# Patient Record
Sex: Female | Born: 1940 | Race: White | Hispanic: No | Smoking: Never smoker
Health system: Southern US, Community
[De-identification: ages and names within clinical notes are randomized; demographics above are authoritative.]

## PROBLEM LIST (undated history)

## (undated) DIAGNOSIS — Z860100 Personal history of colon polyps, unspecified: Secondary | ICD-10-CM

## (undated) DIAGNOSIS — E119 Type 2 diabetes mellitus without complications: Secondary | ICD-10-CM

## (undated) DIAGNOSIS — M2550 Pain in unspecified joint: Secondary | ICD-10-CM

## (undated) DIAGNOSIS — Z8719 Personal history of other diseases of the digestive system: Secondary | ICD-10-CM

## (undated) DIAGNOSIS — G459 Transient cerebral ischemic attack, unspecified: Secondary | ICD-10-CM

## (undated) DIAGNOSIS — G4730 Sleep apnea, unspecified: Secondary | ICD-10-CM

## (undated) DIAGNOSIS — M549 Dorsalgia, unspecified: Secondary | ICD-10-CM

## (undated) DIAGNOSIS — E785 Hyperlipidemia, unspecified: Secondary | ICD-10-CM

## (undated) DIAGNOSIS — G629 Polyneuropathy, unspecified: Secondary | ICD-10-CM

## (undated) DIAGNOSIS — R3915 Urgency of urination: Secondary | ICD-10-CM

## (undated) DIAGNOSIS — Z9889 Other specified postprocedural states: Secondary | ICD-10-CM

## (undated) DIAGNOSIS — M810 Age-related osteoporosis without current pathological fracture: Secondary | ICD-10-CM

## (undated) DIAGNOSIS — G8929 Other chronic pain: Secondary | ICD-10-CM

## (undated) DIAGNOSIS — I10 Essential (primary) hypertension: Secondary | ICD-10-CM

## (undated) DIAGNOSIS — G43909 Migraine, unspecified, not intractable, without status migrainosus: Secondary | ICD-10-CM

## (undated) DIAGNOSIS — N281 Cyst of kidney, acquired: Secondary | ICD-10-CM

## (undated) DIAGNOSIS — M1990 Unspecified osteoarthritis, unspecified site: Secondary | ICD-10-CM

## (undated) DIAGNOSIS — J302 Other seasonal allergic rhinitis: Secondary | ICD-10-CM

## (undated) DIAGNOSIS — K2970 Gastritis, unspecified, without bleeding: Secondary | ICD-10-CM

## (undated) DIAGNOSIS — R350 Frequency of micturition: Secondary | ICD-10-CM

## (undated) DIAGNOSIS — K5790 Diverticulosis of intestine, part unspecified, without perforation or abscess without bleeding: Secondary | ICD-10-CM

## (undated) DIAGNOSIS — M069 Rheumatoid arthritis, unspecified: Secondary | ICD-10-CM

## (undated) DIAGNOSIS — M2540 Effusion, unspecified joint: Secondary | ICD-10-CM

## (undated) DIAGNOSIS — K219 Gastro-esophageal reflux disease without esophagitis: Secondary | ICD-10-CM

## (undated) HISTORY — PX: COLONOSCOPY: SHX174

## (undated) SURGERY — Surgical Case
Anesthesia: *Unknown

---

## 1961-05-25 HISTORY — PX: APPENDECTOMY: SHX54

## 1964-05-25 HISTORY — PX: TUBAL LIGATION: SHX77

## 1966-05-25 HISTORY — PX: FOOT SURGERY: SHX648

## 1991-05-26 DIAGNOSIS — Z9889 Other specified postprocedural states: Secondary | ICD-10-CM

## 1991-05-26 HISTORY — DX: Other specified postprocedural states: Z98.890

## 1992-05-25 HISTORY — PX: ABDOMINAL HYSTERECTOMY: SHX81

## 1992-05-25 HISTORY — PX: OTHER SURGICAL HISTORY: SHX169

## 1993-05-25 HISTORY — PX: CARPAL TUNNEL RELEASE: SHX101

## 2000-05-25 HISTORY — PX: CHOLECYSTECTOMY: SHX55

## 2000-05-25 HISTORY — PX: CYSTOSCOPY: SUR368

## 2000-05-25 HISTORY — PX: OTHER SURGICAL HISTORY: SHX169

## 2008-05-25 HISTORY — PX: KNEE SURGERY: SHX244

## 2008-05-25 HISTORY — PX: JOINT REPLACEMENT: SHX530

## 2008-05-25 HISTORY — PX: OTHER SURGICAL HISTORY: SHX169

## 2012-04-27 ENCOUNTER — Encounter (HOSPITAL_COMMUNITY): Payer: Self-pay | Admitting: *Deleted

## 2012-04-27 ENCOUNTER — Observation Stay (HOSPITAL_COMMUNITY)
Admission: EM | Admit: 2012-04-27 | Discharge: 2012-04-29 | Disposition: A | Discharge status: Home / Self Care | Payer: Medicare Other | Attending: Cardiology | Admitting: Cardiology

## 2012-04-27 DIAGNOSIS — R001 Bradycardia, unspecified: Secondary | ICD-10-CM

## 2012-04-27 DIAGNOSIS — R0609 Other forms of dyspnea: Secondary | ICD-10-CM | POA: Insufficient documentation

## 2012-04-27 DIAGNOSIS — I200 Unstable angina: Secondary | ICD-10-CM

## 2012-04-27 DIAGNOSIS — Z79899 Other long term (current) drug therapy: Secondary | ICD-10-CM | POA: Insufficient documentation

## 2012-04-27 DIAGNOSIS — I498 Other specified cardiac arrhythmias: Secondary | ICD-10-CM | POA: Insufficient documentation

## 2012-04-27 DIAGNOSIS — I10 Essential (primary) hypertension: Secondary | ICD-10-CM

## 2012-04-27 DIAGNOSIS — M069 Rheumatoid arthritis, unspecified: Secondary | ICD-10-CM

## 2012-04-27 DIAGNOSIS — E1142 Type 2 diabetes mellitus with diabetic polyneuropathy: Secondary | ICD-10-CM | POA: Insufficient documentation

## 2012-04-27 DIAGNOSIS — E1149 Type 2 diabetes mellitus with other diabetic neurological complication: Secondary | ICD-10-CM

## 2012-04-27 DIAGNOSIS — R0989 Other specified symptoms and signs involving the circulatory and respiratory systems: Secondary | ICD-10-CM | POA: Insufficient documentation

## 2012-04-27 DIAGNOSIS — R0789 Other chest pain: Principal | ICD-10-CM

## 2012-04-27 DIAGNOSIS — G4733 Obstructive sleep apnea (adult) (pediatric): Secondary | ICD-10-CM

## 2012-04-27 DIAGNOSIS — E785 Hyperlipidemia, unspecified: Secondary | ICD-10-CM

## 2012-04-27 DIAGNOSIS — Z96659 Presence of unspecified artificial knee joint: Secondary | ICD-10-CM | POA: Insufficient documentation

## 2012-04-27 DIAGNOSIS — R4189 Other symptoms and signs involving cognitive functions and awareness: Secondary | ICD-10-CM

## 2012-04-27 DIAGNOSIS — F0390 Unspecified dementia without behavioral disturbance: Secondary | ICD-10-CM | POA: Insufficient documentation

## 2012-04-27 HISTORY — DX: Unspecified osteoarthritis, unspecified site: M19.90

## 2012-04-27 HISTORY — DX: Hyperlipidemia, unspecified: E78.5

## 2012-04-27 HISTORY — DX: Rheumatoid arthritis, unspecified: M06.9

## 2012-04-27 HISTORY — DX: Migraine, unspecified, not intractable, without status migrainosus: G43.909

## 2012-04-27 HISTORY — DX: Type 2 diabetes mellitus without complications: E11.9

## 2012-04-27 HISTORY — DX: Sleep apnea, unspecified: G47.30

## 2012-04-27 HISTORY — DX: Age-related osteoporosis without current pathological fracture: M81.0

## 2012-04-27 HISTORY — DX: Diverticulosis of intestine, part unspecified, without perforation or abscess without bleeding: K57.90

## 2012-04-27 HISTORY — DX: Polyneuropathy, unspecified: G62.9

## 2012-04-27 HISTORY — DX: Gastritis, unspecified, without bleeding: K29.70

## 2012-04-27 LAB — CBC WITH DIFFERENTIAL/PLATELET
Basophils Absolute: 0 10*3/uL (ref 0.0–0.1)
Basophils Relative: 1 % (ref 0–1)
Eosinophils Absolute: 0.2 10*3/uL (ref 0.0–0.7)
Eosinophils Relative: 2 % (ref 0–5)
HCT: 38.7 % (ref 36.0–46.0)
Hemoglobin: 12.6 g/dL (ref 12.0–15.0)
MCH: 28.8 pg (ref 26.0–34.0)
MCHC: 32.6 g/dL (ref 30.0–36.0)
Monocytes Absolute: 0.4 10*3/uL (ref 0.1–1.0)
Monocytes Relative: 6 % (ref 3–12)
RDW: 14.7 % (ref 11.5–15.5)

## 2012-04-27 LAB — COMPREHENSIVE METABOLIC PANEL
AST: 21 U/L (ref 0–37)
Albumin: 3.4 g/dL — ABNORMAL LOW (ref 3.5–5.2)
BUN: 15 mg/dL (ref 6–23)
Calcium: 9.2 mg/dL (ref 8.4–10.5)
Creatinine, Ser: 0.66 mg/dL (ref 0.50–1.10)
Total Bilirubin: 0.1 mg/dL — ABNORMAL LOW (ref 0.3–1.2)
Total Protein: 6.3 g/dL (ref 6.0–8.3)

## 2012-04-27 LAB — POCT I-STAT TROPONIN I: Troponin i, poc: 0.04 ng/mL (ref 0.00–0.08)

## 2012-04-27 NOTE — ED Notes (Addendum)
Pt reports seeing her pcp today and dx w/angina, pt's EKG here in ED is NSR w/low voltage QRS; borderline ECG. Pt requesting something for pain, pt reports taking 2 Rolaids pta, pt is scheduled to have a stress test in the am. Pt reports pain to mid-sternum chest, under (L) breast radiating down (L) arm. Pt also reporting a tightness in her throat.

## 2012-04-27 NOTE — ED Notes (Signed)
Pt states that she was seen at her PCP this morning for similar CP. Pt states that she was diagnosed with angina. Pt states that today she was having pain in her center chest and down left arm. Pt states her husband died a year ago yesterday.

## 2012-04-28 ENCOUNTER — Emergency Department (HOSPITAL_COMMUNITY): Payer: Medicare Other

## 2012-04-28 LAB — APTT: aPTT: 30 seconds (ref 24–37)

## 2012-04-28 LAB — TROPONIN I
Troponin I: <0.3 ng/mL (ref <0.30)
Troponin I: <0.3 ng/mL (ref <0.30)
Troponin I: <0.3 ng/mL (ref <0.30)

## 2012-04-28 LAB — BASIC METABOLIC PANEL
Calcium: 8.8 mg/dL (ref 8.4–10.5)
Creatinine, Ser: 0.71 mg/dL (ref 0.50–1.10)
GFR calc non Af Amer: 85 mL/min — ABNORMAL LOW (ref >90)
Glucose, Bld: 103 mg/dL — ABNORMAL HIGH (ref 70–99)
Sodium: 142 mEq/L (ref 135–145)

## 2012-04-28 LAB — LIPID PANEL
HDL: 54 mg/dL (ref >39)
Total CHOL/HDL Ratio: 3.5 RATIO
Triglycerides: 123 mg/dL (ref <150)

## 2012-04-28 LAB — PROTIME-INR
INR: 0.9 (ref 0.00–1.49)
Prothrombin Time: 12.1 seconds (ref 11.6–15.2)

## 2012-04-28 LAB — CBC
MCH: 28.3 pg (ref 26.0–34.0)
MCHC: 32 g/dL (ref 30.0–36.0)
RBC: 4.03 MIL/uL (ref 3.87–5.11)
RDW: 14.9 % (ref 11.5–15.5)

## 2012-04-28 LAB — CREATININE, SERUM: GFR calc Af Amer: >90 mL/min (ref >90)

## 2012-04-28 LAB — GLUCOSE, CAPILLARY: Glucose-Capillary: 112 mg/dL — ABNORMAL HIGH (ref 70–99)

## 2012-04-28 MED ORDER — SODIUM CHLORIDE 0.9 % IV SOLN
INTRAVENOUS | Status: DC
  Administered 2012-04-28: 22:00:00 via INTRAVENOUS

## 2012-04-28 MED ORDER — DIAZEPAM 5 MG PO TABS
5.0000 mg | ORAL_TABLET | ORAL | Status: AC
  Administered 2012-04-29: 5 mg via ORAL
  Filled 2012-04-28: qty 1

## 2012-04-28 MED ORDER — ASPIRIN EC 81 MG PO TBEC
81.0000 mg | DELAYED_RELEASE_TABLET | Freq: Every day | ORAL | Status: DC
Start: 2012-04-29 — End: 2012-04-29
  Administered 2012-04-29: 81 mg via ORAL
  Filled 2012-04-28: qty 1

## 2012-04-28 MED ORDER — HEPARIN SODIUM (PORCINE) 5000 UNIT/ML IJ SOLN
5000.0000 [IU] | Freq: Two times a day (BID) | INTRAMUSCULAR | Status: DC
  Administered 2012-04-28 (×2): 5000 [IU] via SUBCUTANEOUS
  Filled 2012-04-28 (×4): qty 1

## 2012-04-28 MED ORDER — ASPIRIN 81 MG PO CHEW
324.0000 mg | CHEWABLE_TABLET | Freq: Once | ORAL | Status: AC
  Administered 2012-04-28: 324 mg via ORAL
  Filled 2012-04-28: qty 4

## 2012-04-28 MED ORDER — ONDANSETRON HCL 4 MG/2ML IJ SOLN
4.0000 mg | Freq: Four times a day (QID) | INTRAMUSCULAR | Status: DC | PRN
  Administered 2012-04-28 (×2): 4 mg via INTRAVENOUS
  Filled 2012-04-28 (×2): qty 2

## 2012-04-28 MED ORDER — SODIUM CHLORIDE 0.9 % IJ SOLN
3.0000 mL | Freq: Two times a day (BID) | INTRAMUSCULAR | Status: DC
  Administered 2012-04-28 – 2012-04-29 (×2): 3 mL via INTRAVENOUS

## 2012-04-28 MED ORDER — DONEPEZIL HCL 10 MG PO TABS
10.0000 mg | ORAL_TABLET | Freq: Every day | ORAL | Status: DC
  Administered 2012-04-28: 10 mg via ORAL
  Filled 2012-04-28 (×2): qty 1

## 2012-04-28 MED ORDER — PREDNISONE 50 MG PO TABS
50.0000 mg | ORAL_TABLET | Freq: Every day | ORAL | Status: AC
  Administered 2012-04-29: 50 mg via ORAL
  Filled 2012-04-28: qty 1

## 2012-04-28 MED ORDER — SIMVASTATIN 20 MG PO TABS
20.0000 mg | ORAL_TABLET | Freq: Every day | ORAL | Status: DC
  Administered 2012-04-28: 20 mg via ORAL
  Filled 2012-04-28 (×2): qty 1

## 2012-04-28 MED ORDER — NITROGLYCERIN 0.4 MG SL SUBL
0.4000 mg | SUBLINGUAL_TABLET | SUBLINGUAL | Status: DC | PRN
  Administered 2012-04-28 (×3): 0.4 mg via SUBLINGUAL
  Filled 2012-04-28 (×2): qty 25

## 2012-04-28 MED ORDER — NITROGLYCERIN 2 % TD OINT
0.5000 [in_us] | TOPICAL_OINTMENT | Freq: Once | TRANSDERMAL | Status: AC
  Administered 2012-04-28: 0.5 [in_us] via TOPICAL
  Filled 2012-04-28: qty 1

## 2012-04-28 MED ORDER — SODIUM CHLORIDE 0.9 % IV SOLN: 250.0000 mL | INTRAVENOUS | Status: DC | PRN

## 2012-04-28 MED ORDER — DIPHENHYDRAMINE-APAP (SLEEP) 25-500 MG PO TABS: 1.0000 | ORAL_TABLET | Freq: Every evening | ORAL | Status: DC | PRN

## 2012-04-28 MED ORDER — HYDROXYCHLOROQUINE SULFATE 200 MG PO TABS
200.0000 mg | ORAL_TABLET | Freq: Two times a day (BID) | ORAL | Status: DC
  Administered 2012-04-28 – 2012-04-29 (×3): 200 mg via ORAL
  Filled 2012-04-28 (×4): qty 1

## 2012-04-28 MED ORDER — MORPHINE SULFATE 4 MG/ML IJ SOLN
2.0000 mg | Freq: Once | INTRAMUSCULAR | Status: AC
  Administered 2012-04-28: 2 mg via INTRAVENOUS
  Filled 2012-04-28: qty 1

## 2012-04-28 MED ORDER — ACETAMINOPHEN 325 MG PO TABS
650.0000 mg | ORAL_TABLET | ORAL | Status: DC | PRN
  Administered 2012-04-28: 650 mg via ORAL
  Filled 2012-04-28: qty 2

## 2012-04-28 MED ORDER — ZOLPIDEM TARTRATE 5 MG PO TABS
5.0000 mg | ORAL_TABLET | Freq: Every evening | ORAL | Status: DC | PRN
  Administered 2012-04-28: 5 mg via ORAL
  Filled 2012-04-28: qty 1

## 2012-04-28 MED ORDER — SODIUM CHLORIDE 0.9 % IJ SOLN: 3.0000 mL | INTRAMUSCULAR | Status: DC | PRN

## 2012-04-28 MED ORDER — PREDNISONE 10 MG PO TABS
60.0000 mg | ORAL_TABLET | Freq: Every day | ORAL | Status: DC
  Administered 2012-04-28: 60 mg via ORAL
  Filled 2012-04-28 (×2): qty 1

## 2012-04-28 MED ORDER — PROMETHAZINE HCL 25 MG/ML IJ SOLN
12.5000 mg | Freq: Four times a day (QID) | INTRAMUSCULAR | Status: DC | PRN
  Administered 2012-04-28: 12.5 mg via INTRAVENOUS
  Filled 2012-04-28 (×2): qty 1

## 2012-04-28 MED ORDER — NITROGLYCERIN 0.4 MG SL SUBL: 0.4000 mg | SUBLINGUAL_TABLET | SUBLINGUAL | Status: DC | PRN

## 2012-04-28 MED ORDER — INSULIN ASPART 100 UNIT/ML ~~LOC~~ SOLN
0.0000 [IU] | Freq: Three times a day (TID) | SUBCUTANEOUS | Status: DC
  Administered 2012-04-28 – 2012-04-29 (×3): 2 [IU] via SUBCUTANEOUS
  Administered 2012-04-29: 1 [IU] via SUBCUTANEOUS

## 2012-04-28 MED ORDER — FAMOTIDINE 40 MG PO TABS
40.0000 mg | ORAL_TABLET | Freq: Every day | ORAL | Status: DC
  Administered 2012-04-28 – 2012-04-29 (×2): 40 mg via ORAL
  Filled 2012-04-28 (×2): qty 1

## 2012-04-28 MED ORDER — PANTOPRAZOLE SODIUM 20 MG PO TBEC
20.0000 mg | DELAYED_RELEASE_TABLET | Freq: Every day | ORAL | Status: DC
  Administered 2012-04-28 – 2012-04-29 (×2): 20 mg via ORAL
  Filled 2012-04-28 (×2): qty 1

## 2012-04-28 MED ORDER — METOPROLOL TARTRATE 12.5 MG HALF TABLET
12.5000 mg | ORAL_TABLET | Freq: Two times a day (BID) | ORAL | Status: DC
  Administered 2012-04-28 – 2012-04-29 (×3): 12.5 mg via ORAL
  Filled 2012-04-28 (×4): qty 1

## 2012-04-28 NOTE — H&P (Signed)
History and Physical  Patient ID: Shelia Barber MRN: [id number], SOB: 1941/05/03 71 y.o. Date of Encounter: 04/28/2012, 2:26 AM  Primary Physician: Tarri Fuller, MD Primary Cardiologist: none  Chief Complaint: chest pain  HPI: 71 y.o. female w/ PMHx significant for DM2, HTN who presented to [facility] on 04/28/2012 with complaints of chest pain. She reports having intermittent symptoms over the last week. The initial symptom was left arm pain. This evolved into occasional left neck pain and then left sided chest pain and the tonight, central chest pain. Symptoms are not always associated with each other. Have occurred at rest ( reading newspaper). Unclear if they have occurred with exertion but she has noted increased dyspnea on exertion. She admits that she is not the best historian ("can't remember what I had for breakfast" which she attributes to her diagnosis of dementia). Occasional lightheadedness which is worse than prior but unclear if coincides with her chest pain. Another symptom includes throat tightness. Does feel nauseated this evening, no emesis.  She saw her PCP yesterday 12/4 who was concerned about the possibility of angina (she reports nl EKG at that time) and referred her for stress testing. However, her symptoms worsened to 10/10 during the evening prompting her to seek emergent care.  She reported a 5/10 chest pain, relieved slightly with each of the nitro and with the morphine. Down to 3/10 with addition of nitropaste. At last check, down to 0-1.  EKG revealed NSR with no acute ST changes. CXR was without acute cardiopulmonary abnormalities. Labs are significant for negative istat troponin.   Past Medical History  Diagnosis Date  . Diabetes mellitus without complication   . Gastritis   . Anxiety   . Osteoarthritis   . Sleep apnea   . Osteoporosis   . Neuropathy   . Diverticulosis   . Migraines   . RA (rheumatoid arthritis)   . Cyst of right kidney      two   . Kidney stones   . Cataract      Surgical History:  Past Surgical History  Procedure Date  . Cesarean section   . Appendectomy   . Abdominal hysterectomy     parcel  . Cholecystectomy   . Joint replacement     left knee     Home Meds: Prior to Admission medications   Medication Sig Start Date End Date Taking? Authorizing Provider  acetaminophen (TYLENOL) 500 MG tablet Take 1,000 mg by mouth every 6 (six) hours as needed. Headache or pain   Yes Historical Provider, MD  alendronate (FOSAMAX) 70 MG tablet Take 70 mg by mouth every 7 (seven) days. Take with a full glass of water on an empty stomach. Weekly on Monday   Yes Historical Provider, MD  aspirin EC 81 MG tablet Take 81 mg by mouth daily.   Yes Historical Provider, MD  calcium carbonate (OS-CAL - DOSED IN MG OF ELEMENTAL CALCIUM) 1250 MG tablet Take 1 tablet by mouth 2 (two) times daily.   Yes Historical Provider, MD  cetirizine (ZYRTEC) 10 MG tablet Take 10 mg by mouth daily.   Yes Historical Provider, MD  Diphenhydramine-APAP, sleep, (EQ ACETAMINOPHEN PM PO) Take 1 tablet by mouth at bedtime as needed. sleep   Yes Historical Provider, MD  donepezil (ARICEPT) 10 MG tablet Take 10 mg by mouth at bedtime as needed.   Yes Historical Provider, MD  hydroxychloroquine (PLAQUENIL) 200 MG tablet Take 200 mg by mouth 2 (two) times daily.  Yes Historical Provider, MD  lansoprazole (PREVACID) 15 MG capsule Take 15 mg by mouth daily.   Yes Historical Provider, MD  loratadine (CLARITIN) 10 MG tablet Take 10 mg by mouth daily.   Yes Historical Provider, MD  metFORMIN (GLUCOPHAGE) 500 MG tablet Take 500 mg by mouth 2 (two) times daily with a meal.   Yes Historical Provider, MD  Probiotic Product (ALIGN) 4 MG CAPS Take 1 capsule by mouth daily.   Yes Historical Provider, MD  protein supplement (RESOURCE BENEPROTEIN) POWD Take 1 scoop by mouth 2 (two) times daily as needed. For irritable bowel symptoms   Yes Historical Provider, MD     Allergies:  Allergies  Allergen Reactions  . Ivp Dye (Iodinated Diagnostic Agents) Anaphylaxis  . Aspirin Other (See Comments)    Upset stomach Pt does take coated baby aspirin  . Imodium (Loperamide) Other (See Comments)    Due to Diverticulosis    History   Social History  . Marital Status: Married    Spouse Name: N/A    Number of Children: N/A  . Years of Education: N/A   Occupational History  . Not on file.   Social History Main Topics  . Smoking status: Never Smoker   . Smokeless tobacco: Not on file  . Alcohol Use: No  . Drug Use: No  . Sexually Active:    Other Topics Concern  . Not on file   Social History Narrative  . No narrative on file     History reviewed. No pertinent family history.  Review of Systems: General: negative for chills, fever, night sweats or weight changes.  Cardiovascular: as per HPI, no CHF symptoms, R leg always slightly swollen due to injury Dermatological: negative for rash Respiratory: negative for cough or wheezing Urologic: negative for hematuria Abdominal: negative for  vomiting, bright red blood per rectum, melena, or hematemesis. +diarrhea- ?IBS per pt Neurologic: negative for visual changes, syncope/ +lightheadedness All other systems reviewed and are otherwise negative except as noted above.  Labs:   Lab Results  Component Value Date   WBC 6.2 04/27/2012   HGB 12.6 04/27/2012   HCT 38.7 04/27/2012   MCV 88.4 04/27/2012   PLT 189 04/27/2012    Lab 04/27/12 2103  NA 143  K 4.1  CL 105  CO2 26  BUN 15  CREATININE 0.66  CALCIUM 9.2  PROT 6.3  BILITOT 0.1*  ALKPHOS 76  ALT 16  AST 21  GLUCOSE 140*   No results found for this basename: CKTOTAL:4,CKMB:4,TROPONINI:4 in the last 72 hours No results found for this basename: CHOL, HDL, LDLCALC, TRIG   No results found for this basename: DDIMER    Radiology/Studies:  Dg Chest Port 1 View  04/28/2012  *RADIOLOGY REPORT*  Clinical Data: Chest pain and  shortness of breath.  PORTABLE CHEST - 1 VIEW  Comparison: None.  Findings: Low lung volumes are seen.  Both lungs are clear.  No evidence of pleural effusion.  Heart size is normal.  IMPRESSION: Low lung volumes.  No acute findings.   Original Report Authenticated By: Myles Rosenthal, M.D.      EKG: sinus, no acute changes, no comparison  Physical Exam: Blood pressure 149/66, pulse 61, temperature 98 F (36.7 C), temperature source Oral, resp. rate 16, SpO2 98.00%. General: Well developed, well nourished, in no acute distress. Head: Normocephalic, atraumatic, sclera non-icteric, nares are without discharge Neck: Supple. Negative for carotid bruits. JVD not elevated. Lungs: Clear bilaterally to auscultation without wheezes,  rales, or rhonchi. Breathing is unlabored. Heart: RRR with S1 S2. No murmurs, rubs, or gallops appreciated. Abdomen: Soft, non-tender, non-distended with normoactive bowel sounds. No rebound/guarding. No obvious abdominal masses. Msk:  Strength and tone appear normal for age. Extremities: R leg with +1 edema (chronic per pt). No clubbing or cyanosis. Distal pedal pulses are 2+ and equal bilaterally. Neuro: Alert and oriented X 3. Moves all extremities spontaneously. Psych:  Responds to questions appropriately with a normal affect.   1. Chest pain, mostly typical symptoms 2. DM2 3. HTN 4. Mild dementia/cognitive decline  ASSESSMENT AND PLAN:  71 y.o. female w/ PMHx significant for DM2, HTN who presented to [facility] on 04/28/2012 with complaints of chest pain.   Her symptoms are mostly typical in nature (left sided, radiation to neck, relieved with nitro, nausea, diaphoresis) but what is atypical is the number of different types of discomfort that she has that are not necessarily related temporally (throat tightness sometimes, central chest pain sometimes, left sided chest pain, left arm pain).   Nonetheless, she has risk factors (post-menopauseal, DM2, HTN,  rheumatoid arthritis). Encouragingly, her EKG is normal and her initial troponin is negative. Goal of being chest pain free almost achieved with nitro paste.  In the absence of elevated biomarkers or EKG changes, will hold off on heparinizing but will have low threshold to start if clinical picture changes. If biomarkers and symptoms remain negative overnight, her presentation allows for either invasive or noninvasive risk stratification (pharmacologic stress).  Start statin, low dose BB for BP. Hold metformin, cover with sliding scale. **NOTE: Patient reports hives with contrast media.  NPO for either cath or MPI.  PPI for prophylaxis. Subq heparin for DVT prophy Full code  Signed, Othell Jaime C. MD 04/28/2012, 2:26 AM

## 2012-04-28 NOTE — Progress Notes (Signed)
Utilization review completed.  

## 2012-04-28 NOTE — ED Provider Notes (Signed)
History     CSN: [id number]  Arrival date & time 04/27/12  2051   First MD Initiated Contact with Patient 04/27/12 2352      Chief Complaint  Patient presents with  . Chest Pain    (Consider location/radiation/quality/duration/timing/severity/associated sxs/prior treatment) HPI Comments: 71 year old female with a history of diabetes, history of hypertension though she no longer takes medications and a history of family history of heart disease with radiation to the left arm, a feeling of choking, dyspnea of exertion. She was seen at her family doctor's earlier in the day and told that she had angina, scheduled for a stress test today, her pain came on again at 8:30 last night, it has been persistent, moderate, rated 10 out of 10, nothing seems to make this better or worse. She has not had any nitroglycerin today, she does take intermittent aspirin  Patient is a 71 y.o. female presenting with chest pain. The history is provided by the patient.  Chest Pain     Past Medical History  Diagnosis Date  . Diabetes mellitus without complication   . Gastritis   . Anxiety   . Osteoarthritis   . Sleep apnea   . Osteoporosis   . Neuropathy   . Diverticulosis   . Migraines   . RA (rheumatoid arthritis)   . Cyst of right kidney     two   . Kidney stones   . Cataract     Past Surgical History  Procedure Date  . Cesarean section   . Appendectomy   . Abdominal hysterectomy     parcel  . Cholecystectomy   . Joint replacement     left knee    History reviewed. No pertinent family history.  History  Substance Use Topics  . Smoking status: Never Smoker   . Smokeless tobacco: Not on file  . Alcohol Use: No    OB History    Grav Para Term Preterm Abortions TAB SAB Ect Mult Living                  Review of Systems  Cardiovascular: Positive for chest pain.  All other systems reviewed and are negative.    Allergies  Ivp dye; Aspirin; and Imodium  Home Medications    Current Outpatient Rx  Name  Route  Sig  Dispense  Refill  . ACETAMINOPHEN 500 MG PO TABS   Oral   Take 1,000 mg by mouth every 6 (six) hours as needed. Headache or pain         . ALENDRONATE SODIUM 70 MG PO TABS   Oral   Take 70 mg by mouth every 7 (seven) days. Take with a full glass of water on an empty stomach. Weekly on Monday         . ASPIRIN EC 81 MG PO TBEC   Oral   Take 81 mg by mouth daily.         Marland Kitchen CALCIUM CARBONATE 1250 MG PO TABS   Oral   Take 1 tablet by mouth 2 (two) times daily.         Marland Kitchen CETIRIZINE HCL 10 MG PO TABS   Oral   Take 10 mg by mouth daily.         . EQ ACETAMINOPHEN PM PO   Oral   Take 1 tablet by mouth at bedtime as needed. sleep         . DONEPEZIL HCL 10 MG PO TABS   Oral  Take 10 mg by mouth at bedtime as needed.         Marland Kitchen HYDROXYCHLOROQUINE SULFATE 200 MG PO TABS   Oral   Take 200 mg by mouth 2 (two) times daily.         Marland Kitchen LANSOPRAZOLE 15 MG PO CPDR   Oral   Take 15 mg by mouth daily.         Marland Kitchen LORATADINE 10 MG PO TABS   Oral   Take 10 mg by mouth daily.         Marland Kitchen METFORMIN HCL 500 MG PO TABS   Oral   Take 500 mg by mouth 2 (two) times daily with a meal.         . ALIGN 4 MG PO CAPS   Oral   Take 1 capsule by mouth daily.         Scherrie November PO POWD   Oral   Take 1 scoop by mouth 2 (two) times daily as needed. For irritable bowel symptoms           BP 149/66  Pulse 61  Temp 98 F (36.7 C) (Oral)  Resp 16  SpO2 98%  Physical Exam  Nursing note and vitals reviewed. Constitutional: She appears well-developed and well-nourished. No distress.  HENT:  Head: Normocephalic and atraumatic.  Mouth/Throat: Oropharynx is clear and moist. No oropharyngeal exudate.  Eyes: Conjunctivae normal and EOM are normal. Pupils are equal, round, and reactive to light. Right eye exhibits no discharge. Left eye exhibits no discharge. No scleral icterus.  Neck: Normal range of motion. Neck supple. No JVD  present. No thyromegaly present.  Cardiovascular: Normal rate, regular rhythm, normal heart sounds and intact distal pulses.  Exam reveals no gallop and no friction rub.   No murmur heard. Pulmonary/Chest: Effort normal and breath sounds normal. No respiratory distress. She has no wheezes. She has no rales.  Abdominal: Soft. Bowel sounds are normal. She exhibits no distension and no mass. There is no tenderness.  Musculoskeletal: Normal range of motion. She exhibits no edema and no tenderness.  Lymphadenopathy:    She has no cervical adenopathy.  Neurological: She is alert. Coordination normal.  Skin: Skin is warm and dry. No rash noted. No erythema.  Psychiatric: She has a normal mood and affect. Her behavior is normal.    ED Course  Procedures (including critical care time)  Labs Reviewed  COMPREHENSIVE METABOLIC PANEL - Abnormal; Notable for the following:    Glucose, Bld 140 (*)     Albumin 3.4 (*)     Total Bilirubin 0.1 (*)     GFR calc non Af Amer 87 (*)     All other components within normal limits  CBC WITH DIFFERENTIAL  POCT I-STAT TROPONIN I  APTT  PROTIME-INR   Dg Chest Port 1 View  04/28/2012  *RADIOLOGY REPORT*  Clinical Data: Chest pain and shortness of breath.  PORTABLE CHEST - 1 VIEW  Comparison: None.  Findings: Low lung volumes are seen.  Both lungs are clear.  No evidence of pleural effusion.  Heart size is normal.  IMPRESSION: Low lung volumes.  No acute findings.   Original Report Authenticated By: Myles Rosenthal, M.D.      1. Unstable angina       MDM  At this time the patient is not having signs of dysfunction, she has an EKG with nonspecific T waves but no signs of acute ischemia, she does have what sounds to be  unstable angina given her 2 weeks of progressive symptoms, laboratory data has a normal troponin, CBC, normal cooperative metabolic panel will consult cardiology.  ED ECG REPORT  I personally interpreted this EKG   Date: 04/28/2012   Rate: 65   Rhythm: normal sinus rhythm  QRS Axis: left  Intervals: normal  ST/T Wave abnormalities: nonspecific T wave changes  Conduction Disutrbances:none  Narrative Interpretation:   Old EKG Reviewed: none available  Patient has been reevaluated, she still has ongoing chest pain which is now 7/10 from 10 out of 10. The nitroglycerin sublingual tablets has helped improve her pain, it has seemed to come back since the nitroglycerin. Morphine ordered, nitro paste ordered at the recommendation of the cardiologist, I suspect that the patient is having unstable angina, I have reevaluated her several times, she is hemodynamically stable on each reevaluation but has ongoing pain and appears uncomfortable. Her chest x-ray showed no acute findings, troponin is negative, critical care delivered.   CRITICAL CARE Performed by: Vida Roller   Total critical care time: 35  Critical care time was exclusive of separately billable procedures and treating other patients.  Critical care was necessary to treat or prevent imminent or life-threatening deterioration.  Critical care was time spent personally by me on the following activities: development of treatment plan with patient and/or surrogate as well as nursing, discussions with consultants, evaluation of patient's response to treatment, examination of patient, obtaining history from patient or surrogate, ordering and performing treatments and interventions, ordering and review of laboratory studies, ordering and review of radiographic studies, pulse oximetry and re-evaluation of patient's condition.   Vida Roller, MD 04/28/12 [phone]

## 2012-04-28 NOTE — Progress Notes (Signed)
Cardiac enzymes are negative.  Patient was not pretreated with meds yesterday.  No further pain today. Had nausea earlier this evening, then some vomiting.  No tenderness on exam.  No ECG changes.  Enzymes at 5pm today were negative.    Plan is for cath study tomorrow.  She is agreeable to proceed.  Will start some IV fluids, give clear liquid breakfast, and redo Prednisone in am.   Cath tomorrow.

## 2012-04-28 NOTE — ED Notes (Signed)
MD at bedside. 

## 2012-04-28 NOTE — Progress Notes (Signed)
Patient Name: Shelia Barber Date of Encounter: 04/28/2012     Active Problems:  * No active hospital problems. *     SUBJECTIVE  No further pain.  Initial markers negative. Careful history taken.  She has sharp chest pain that did not ease off while wrapping presents.  She also has a two week history of left arm pain.  She also provides history of hives with contrast.    CURRENT MEDS    . [COMPLETED] aspirin  324 mg Oral Once  . aspirin EC  81 mg Oral Daily  . donepezil  10 mg Oral QHS  . famotidine  40 mg Oral Daily  . heparin  5,000 Units Subcutaneous BID  . hydroxychloroquine  200 mg Oral BID  . insulin aspart  0-9 Units Subcutaneous TID WC  . metoprolol tartrate  12.5 mg Oral BID  . [COMPLETED] morphine  2 mg Intravenous Once  . [COMPLETED] nitroGLYCERIN  0.5 inch Topical Once  . pantoprazole  20 mg Oral Daily  . predniSONE  60 mg Oral Q breakfast  . simvastatin  20 mg Oral q1800    OBJECTIVE  Filed Vitals:   04/28/12 0300 04/28/12 0315 04/28/12 0330 04/28/12 0454  BP: 113/59 113/58 116/57 146/70  Pulse: 62 61 62 60  Temp:    97.9 F (36.6 C)  TempSrc:    Oral  Resp:  14 14 16   Height:    5\' 1"  (1.549 m)  Weight:    233 lb 11 oz (106 kg)  SpO2: 96% 96% 96% 98%   No intake or output data in the 24 hours ending 04/28/12 0759 Filed Weights   04/28/12 0454  Weight: 233 lb 11 oz (106 kg)    PHYSICAL EXAM  General: Pleasant, NAD. Neuro: Alert and oriented X 3. Moves all extremities spontaneously. Psych: Normal affect. HEENT:  Normal  Neck: Supple without bruits or JVD. Lungs:  Resp regular and unlabored, CTA. Heart: RRR no s3, s4, or murmurs. Extremities: No clubbing, cyanosis or edema. DP/PT/Radials 2+ and equal bilaterally.  Accessory Clinical Findings  CBC  Basename 04/28/12 0330 04/27/12 2103  WBC 5.9 6.2  NEUTROABS -- 3.4  HGB 11.4* 12.6  HCT 35.6* 38.7  MCV 88.3 88.4  PLT 176 189   Basic Metabolic Panel  Basename 04/28/12 0330 04/27/12  2103  NA -- 143  K -- 4.1  CL -- 105  CO2 -- 26  GLUCOSE -- 140*  BUN -- 15  CREATININE 0.66 0.66  CALCIUM -- 9.2  MG -- --  PHOS -- --   Liver Function Tests  [facility] 04/27/12 2103  AST 21  ALT 16  ALKPHOS 76  BILITOT 0.1*  PROT 6.3  ALBUMIN 3.4*   No results found for this basename: LIPASE:2,AMYLASE:2 in the last 72 hours Cardiac Enzymes  Basename 04/28/12 0330  CKTOTAL --  CKMB --  CKMBINDEX --  TROPONINI <0.30   BNP No components found with this basename: POCBNP:3 D-Dimer No results found for this basename: DDIMER:2 in the last 72 hours Hemoglobin A1C No results found for this basename: HGBA1C in the last 72 hours Fasting Lipid Panel  Basename 04/28/12 0330  CHOL 191  HDL 54  LDLCALC 112*  TRIG 123  CHOLHDL 3.5  LDLDIRECT --   Thyroid Function Tests No results found for this basename: TSH,T4TOTAL,FREET3,T3FREE,THYROIDAB in the last 72 hours    ECG  First tracing without changes.    Radiology/Studies  Dg Chest [facility] [address]  04/28/2012  *RADIOLOGY REPORT*  Clinical Data: Chest pain and shortness of breath.  PORTABLE CHEST - 1 VIEW  Comparison: None.  Findings: Low lung volumes are seen.  Both lungs are clear.  No evidence of pleural effusion.  Heart size is normal.  IMPRESSION: Low lung volumes.  No acute findings.   Original Report Authenticated By: Myles Rosenthal, M.D.     ASSESSMENT AND PLAN   1.  Chest pain  --worrisome for accelerating angina 2.  DM 3.  Other CRF  Rec:  1.  I would favor cath.  She is stable and has history of contrast allergy so will begin pretreatment.  I will check on her later today but most likely study will be tomorrow am.    Signed, Shawnie Pons MD, [facility], [facility]

## 2012-04-29 ENCOUNTER — Encounter (HOSPITAL_COMMUNITY): Payer: Self-pay | Admitting: Physician Assistant

## 2012-04-29 ENCOUNTER — Encounter (HOSPITAL_COMMUNITY)
Admission: EM | Disposition: A | Discharge status: Home / Self Care | Payer: Self-pay | Source: Home / Self Care | Attending: Emergency Medicine

## 2012-04-29 DIAGNOSIS — R4189 Other symptoms and signs involving cognitive functions and awareness: Secondary | ICD-10-CM

## 2012-04-29 DIAGNOSIS — E785 Hyperlipidemia, unspecified: Secondary | ICD-10-CM

## 2012-04-29 DIAGNOSIS — G4733 Obstructive sleep apnea (adult) (pediatric): Secondary | ICD-10-CM

## 2012-04-29 DIAGNOSIS — R001 Bradycardia, unspecified: Secondary | ICD-10-CM

## 2012-04-29 DIAGNOSIS — I10 Essential (primary) hypertension: Secondary | ICD-10-CM

## 2012-04-29 DIAGNOSIS — R0789 Other chest pain: Secondary | ICD-10-CM

## 2012-04-29 DIAGNOSIS — M069 Rheumatoid arthritis, unspecified: Secondary | ICD-10-CM

## 2012-04-29 DIAGNOSIS — I2510 Atherosclerotic heart disease of native coronary artery without angina pectoris: Secondary | ICD-10-CM

## 2012-04-29 DIAGNOSIS — E1149 Type 2 diabetes mellitus with other diabetic neurological complication: Secondary | ICD-10-CM

## 2012-04-29 HISTORY — PX: LEFT HEART CATHETERIZATION WITH CORONARY ANGIOGRAM: SHX5451

## 2012-04-29 HISTORY — PX: CARDIAC CATHETERIZATION: SHX172

## 2012-04-29 LAB — COMPREHENSIVE METABOLIC PANEL
ALT: 15 U/L (ref 0–35)
ALT: 15 U/L (ref 0–35)
AST: 15 U/L (ref 0–37)
Alkaline Phosphatase: 55 U/L (ref 39–117)
CO2: 29 mEq/L (ref 19–32)
Calcium: 8.7 mg/dL (ref 8.4–10.5)
Chloride: 107 mEq/L (ref 96–112)
Creatinine, Ser: 0.66 mg/dL (ref 0.50–1.10)
GFR calc Af Amer: >90 mL/min (ref >90)
GFR calc Af Amer: >90 mL/min (ref >90)
GFR calc non Af Amer: 88 mL/min — ABNORMAL LOW (ref >90)
Glucose, Bld: 126 mg/dL — ABNORMAL HIGH (ref 70–99)
Glucose, Bld: 108 mg/dL — ABNORMAL HIGH (ref 70–99)
Potassium: 3.9 mEq/L (ref 3.5–5.1)
Sodium: 143 mEq/L (ref 135–145)
Sodium: 146 mEq/L — ABNORMAL HIGH (ref 135–145)
Total Bilirubin: 0.2 mg/dL — ABNORMAL LOW (ref 0.3–1.2)
Total Protein: 5.6 g/dL — ABNORMAL LOW (ref 6.0–8.3)

## 2012-04-29 LAB — GLUCOSE, CAPILLARY: Glucose-Capillary: 107 mg/dL — ABNORMAL HIGH (ref 70–99)

## 2012-04-29 LAB — CBC
HCT: 36.6 % (ref 36.0–46.0)
Hemoglobin: 11.6 g/dL — ABNORMAL LOW (ref 12.0–15.0)
MCHC: 31.7 g/dL (ref 30.0–36.0)
RBC: 4.13 MIL/uL (ref 3.87–5.11)
WBC: 7 10*3/uL (ref 4.0–10.5)

## 2012-04-29 LAB — LIPASE, BLOOD: Lipase: 56 U/L (ref 11–59)

## 2012-04-29 SURGERY — LEFT HEART CATHETERIZATION WITH CORONARY ANGIOGRAM
Anesthesia: LOCAL

## 2012-04-29 MED ORDER — ACETAMINOPHEN 325 MG PO TABS: 650.0000 mg | ORAL_TABLET | ORAL | Status: DC | PRN

## 2012-04-29 MED ORDER — ONDANSETRON HCL 4 MG/2ML IJ SOLN: 4.0000 mg | Freq: Four times a day (QID) | INTRAMUSCULAR | Status: DC | PRN

## 2012-04-29 MED ORDER — DIPHENHYDRAMINE HCL 50 MG/ML IJ SOLN
INTRAMUSCULAR | Status: AC
  Filled 2012-04-29: qty 1

## 2012-04-29 MED ORDER — ATORVASTATIN CALCIUM 20 MG PO TABS: 20.0000 mg | ORAL_TABLET | Freq: Every day | ORAL | Status: DC

## 2012-04-29 MED ORDER — NITROGLYCERIN 0.2 MG/ML ON CALL CATH LAB
INTRAVENOUS | Status: AC
  Filled 2012-04-29: qty 1

## 2012-04-29 MED ORDER — MIDAZOLAM HCL 2 MG/2ML IJ SOLN
INTRAMUSCULAR | Status: AC
  Filled 2012-04-29: qty 2

## 2012-04-29 MED ORDER — HEPARIN (PORCINE) IN NACL 2-0.9 UNIT/ML-% IJ SOLN
INTRAMUSCULAR | Status: AC
  Filled 2012-04-29: qty 1500

## 2012-04-29 MED ORDER — METFORMIN HCL 500 MG PO TABS: 500.0000 mg | ORAL_TABLET | Freq: Two times a day (BID) | ORAL | Status: DC

## 2012-04-29 MED ORDER — OXYCODONE-ACETAMINOPHEN 5-325 MG PO TABS: 1.0000 | ORAL_TABLET | ORAL | Status: DC | PRN

## 2012-04-29 MED ORDER — LIDOCAINE HCL (PF) 1 % IJ SOLN
INTRAMUSCULAR | Status: AC
  Filled 2012-04-29: qty 30

## 2012-04-29 MED ORDER — SODIUM CHLORIDE 0.45 % IV SOLN
INTRAVENOUS | Status: AC
  Administered 2012-04-29: 11:00:00 via INTRAVENOUS

## 2012-04-29 MED ORDER — DIAZEPAM 2 MG PO TABS: 2.0000 mg | ORAL_TABLET | ORAL | Status: DC | PRN

## 2012-04-29 NOTE — Progress Notes (Signed)
Cardiology Progress Note Patient Name: Shelia Barber Date of Encounter: 04/29/2012, 8:01 AM     Subjective  One episode of chest pain last night that spontaneously resolved. No chest pain or sob this morning.    Objective   Telemetry: sinus rhythm 50-60s  Medications: . aspirin EC  81 mg Oral Daily  . diazepam  5 mg Oral On Call  . donepezil  10 mg Oral QHS  . famotidine  40 mg Oral Daily  . heparin  5,000 Units Subcutaneous BID  . hydroxychloroquine  200 mg Oral BID  . insulin aspart  0-9 Units Subcutaneous TID WC  . metoprolol tartrate  12.5 mg Oral BID  . pantoprazole  20 mg Oral Daily  . predniSONE  50 mg Oral Q breakfast  . simvastatin  20 mg Oral q1800  . sodium chloride  3 mL Intravenous Q12H  . [DISCONTINUED] predniSONE  60 mg Oral Q breakfast   . sodium chloride 50 mL/hr at 04/28/12 2150    Physical Exam: Temp:  [97.3 F (36.3 C)-98.2 F (36.8 C)] 97.3 F (36.3 C) (12/06 0500) Pulse Rate:  [55-68] 68  (12/06 0500) Resp:  [18] 18  (12/05 2100) BP: (115-145)/(64-77) 145/64 mmHg (12/06 0500) SpO2:  [94 %-99 %] 99 % (12/06 0500) Weight:  [233 lb (105.688 kg)] 233 lb (105.688 kg) (12/06 0500)  General: Pleasant elderly white female, in no acute distress. Head: Normocephalic, atraumatic, sclera non-icteric, nares are without discharge.  Neck: Supple. Negative for carotid bruits or JVD Lungs: Clear bilaterally to auscultation without wheezes, rales, or rhonchi. Breathing is unlabored. Heart: RRR S1 S2 without murmurs, rubs, or gallops.  Abdomen: Soft, non-tender, non-distended with normoactive bowel sounds. No rebound/guarding. No obvious abdominal masses. Msk:  Strength and tone appear normal for age. Extremities: Trace bilat ankle edema. No clubbing or cyanosis. Distal pedal pulses are intact and equal bilaterally. Neuro: Alert and oriented X 3. Moves all extremities spontaneously. Psych:  Responds to questions appropriately with a normal  affect.   Intake/Output Summary (Last 24 hours) at 04/29/12 0801 Last data filed at 04/28/12 1700  Gross per 24 hour  Intake   1200 ml  Output      3 ml  Net   1197 ml    Labs:  [facility] 04/29/12 0535 04/29/12 0025  NA 146* 143  K 4.2 3.9  CL 108 107  CO2 30 29  GLUCOSE 108* 126*  BUN 10 10  CREATININE 0.66 0.65  CALCIUM 8.7 8.9  MG -- --  PHOS -- --    Basename 04/29/12 0535 04/29/12 0025  AST 17 15  ALT 15 15  ALKPHOS 53 55  BILITOT 0.2* 0.2*  PROT 5.6* 5.8*  ALBUMIN 2.9* 3.1*    Basename 04/29/12 0535  LIPASE 56  AMYLASE --    Basename 04/29/12 0535 04/28/12 0330 04/27/12 2103  WBC 7.0 5.9 --  NEUTROABS -- -- 3.4  HGB 11.6* 11.4* --  HCT 36.6 35.6* --  MCV 88.6 88.3 --  PLT 170 176 --    Basename 04/28/12 1708 04/28/12 0850 04/28/12 0330  CKTOTAL -- -- --  CKMB -- -- --  TROPONINI <0.30 <0.30 <0.30   Basename 04/28/12 0330  CHOL 191  HDL 54  LDLCALC 112*  TRIG 123  CHOLHDL 3.5    Radiology/Studies:  Dg Chest Port 1 View  04/28/2012  *RADIOLOGY REPORT*  Clinical Data: Chest pain and shortness of breath.  PORTABLE CHEST - 1  VIEW  Comparison: None.  Findings: Low lung volumes are seen.  Both lungs are clear.  No evidence of pleural effusion.  Heart size is normal.  IMPRESSION: Low lung volumes.  No acute findings.   Original Report Authenticated By: Myles Rosenthal, M.D.      Assessment and Plan   1. Chest pain concerning for unstable angina 2. Diabetes Mellitus, Type 2 3. Hypertension 4. Sinus bradycardia 5. Sleep Apnea  Plans for cath today. Patient has h/o hives with IV dye. Received 60mg  prednisone yesterday and is scheduled to received 50mg  again this morning. Labs ok. Vitals stable.    Signed, HOPE, JESSICA PA-C  Patient seen and examined and history reviewed. Agree with above findings and plan. No further complaints. Pre treated for contrast allergy. Will proceed with cardiac cath today.  Theron Arista [facility]  04/29/2012 11:15  AM

## 2012-04-29 NOTE — Interval H&P Note (Signed)
History and Physical Interval Note:  04/29/2012 10:27 AM  Shelia Barber  has presented today for surgery, with the diagnosis of Chest pain  The various methods of treatment have been discussed with the patient and family. After consideration of risks, benefits and other options for treatment, the patient has consented to  Procedure(s) (LRB) with comments: LEFT HEART CATHETERIZATION WITH CORONARY ANGIOGRAM (N/A) as a surgical intervention .  The patient's history has been reviewed, patient examined, no change in status, stable for surgery.  I have reviewed the patient's chart and labs.  Questions were answered to the patient's satisfaction.     Charlton Haws

## 2012-04-29 NOTE — Discharge Summary (Signed)
Patient seen and examined and history reviewed. Agree with above findings and plan. See earlier rounding note. Cath shows nonobstructive CAD. Will treat medically.  Theron Arista JordanMD 04/29/2012 2:03 PM

## 2012-04-29 NOTE — Discharge Summary (Signed)
Discharge Summary   Patient ID: Shelia Barber,  MRN: [id number], DOB/AGE: Jan 20, 1941 71 y.o.  Admit date: 04/27/2012 Discharge date: 04/29/2012  Primary Physician: Tarri Fuller, MD Primary Cardiologist: new to cardiology- initially assessed by Dr. Riley Kill  Discharge Diagnoses Principal Problem:  *Atypical chest pain  - s/p cardiac cath 04/29/12: 30% proximal LAD lesion, otherwise normal coronaries; LVEF 55% and no WMAs  Active Problems:  Type 2 diabetes mellitus with neurological manifestations  - to resume metformin 05/01/12  - neuropathy noted on past medical history  Morbid obesity  Essential hypertension  Sinus bradycardia  Cognitive decline  OSA (obstructive sleep apnea)  Hyperlipidemia  - newly diagnosed, LDL 112, HDL 54, TG 123, TC 191  - initiated on atorvastatin 20mg  PO daily  - check LFTs in 6 weeks   Rheumatoid arthritis  - chronic, stable  Allergies Allergies  Allergen Reactions  . Ivp Dye (Iodinated Diagnostic Agents) Anaphylaxis  . Aspirin Other (See Comments)    Upset stomach Pt does take coated baby aspirin  . Imodium (Loperamide) Other (See Comments)    Due to Diverticulosis    Diagnostic Studies/Procedures  PORTABLE CHEST X-RAY - 04/28/12  PORTABLE CHEST - 1 VIEW  Comparison: None.  Findings: Low lung volumes are seen. Both lungs are clear. No  evidence of pleural effusion. Heart size is normal.  IMPRESSION:  Low lung volumes. No acute findings.  CARDIAC CATHETERIZATION - 04/29/12  Coronary Arteries:  Right dominant with no anomalies  LM: Normal  LAD: 30% proximal disease normal mid and distal  D1: Small and normal  D2 Large and normal  D3: Small and normal  Circumflex: Normal  OM1: Normal  OM2: Normal  RCA: Dominant and normal  PDA: normal  PLA: normal  Ventriculography: EF: 55 %, no RWMA;s  Hemodynamics:  Aortic Pressure: 158 85 mmHg LV Pressure: 160 12 MmHg  History of Present Illness  Shelia Barber is a 71yo female who was  hospitalized at [facility] on 04/27/12 with the above problem list. She has a history of DM2 and HTN. She reported experiencing intermittent left arm pain radiation, at time neck pain and at times left-side pain at rest and without association to each other. She developed chest pain the day she presented to the ED. She self-reported being a poor historian due to cognitive decline/dementia. She did endorse occasional lightheadedness, diaphoresis and nausea. She saw her PCP the day prior to presenting at which time an EKG revealed no ischemia. Stress testing was arranged, however her chest pain escalated to 10/10 that evening prompting her to seek emergent care. There, EKG revealed no ischemic changes. CXR as above revealed no acute abnormalities. POC TnI WNL. Her chest pain was relieved with NTG paste and morphine. Her HPI carried both typical and atypical features for cardiac ischemia. Given her cardiac risk factors, and temporally accelerating chest pain, the decision was made to observe overnight, and plan for diagnostic cardiac catheterization the following day.   Hospital Course   She has an allergy to contrast media. She was premedicated yesterday in anticipation for cath today. BB was initially started, but then held due to asymptomatic sinus bradycardia. Lipid panel as below revealed LDL 112. Premedication was continued. The following morning, she was informed, consented and prepped for the procedure which as above revealed a 30% proximal LAD lesion, otherwise normal coronaries; LVEF 55% and no WMAs. She tolerated the procedure well and without complications. R groin hemostasis was achieved with angioseal. She was evaluated  by Dr. Eden Emms and deemed stable for discharge. She will follow-up in the office as outlined below. She has been started on low-dose atorvastatin for newly diagnosed hyperlipidemia. LFTs will need to be checked in 6 weeks as well as medication tolerance. This information,  including post-cath instructions, has been clearly outlined in the discharge AVS. Given that her cardiac catheterization revealed no obstructive CAD, coronary dissection or vasospasm, non-cardiac etiologies have been placed higher on the differential. She has been advised to continue following up with her PCP regarding her atypical chest pain. Consider musculoskeletal, neuropathic (history of neuropathy), autoimmune (history of GI) or GI etiologies.   Discharge Vitals:  Blood pressure 143/82, pulse 56, temperature 97.3 F (36.3 C), temperature source Oral, resp. rate 18, height 5\' 1"  (1.549 m), weight 105.688 kg (233 lb), SpO2 99.00%.  Weight change: -0.312 kg (-11 oz)  Labs: Recent Labs  Basename 04/29/12 0535 04/28/12 0330   WBC 7.0 5.9   HGB 11.6* 11.4*   HCT 36.6 35.6*   MCV 88.6 88.3   PLT 170 176    Lab 04/29/12 0535 04/29/12 0025 04/28/12 0850  NA 146* 143 142  K 4.2 3.9 3.8  CL 108 107 105  CO2 30 29 28   BUN 10 10 14   CREATININE 0.66 0.65 0.71  CALCIUM 8.7 8.9 8.8  PROT 5.6* -- --  BILITOT 0.2* -- --  ALKPHOS 53 -- --  ALT 15 -- --  AST 17 -- --  AMYLASE -- -- --  LIPASE 56 -- --  GLUCOSE 108* 126* 103*    Recent Labs  Basename 04/28/12 1708 04/28/12 0850 04/28/12 0330   CKTOTAL -- -- --   CKMB -- -- --   CKMBINDEX -- -- --   TROPONINI <0.30 <0.30 <0.30   Recent Labs  Basename 04/28/12 0330   CHOL 191   HDL 54   LDLCALC 112*   TRIG 123   CHOLHDL 3.5   LDLDIRECT --   Disposition:  Discharge Orders    Future Appointments: Provider: Department: Dept Phone: Center:   05/30/2012 2:20 PM Beatrice Lecher, PA [facility] Main Office [place]) [phone] [facility]     Future Orders Please Complete By Expires   Diet - low sodium heart healthy      Increase activity slowly        Follow-up Information    Follow up with Tereso Newcomer, PA. On 05/30/2012. (At 2:20 PM for follow-up. )    Contact information:   [address] [place]  [place] [address] [phone]       Schedule an appointment as soon as possible for a visit with Tarri Fuller, MD. (In 1-2 weeks. )    Contact information:   [address] [place] [address] [phone]         Discharge Medications:    Medication List     As of 04/29/2012  1:37 PM    START taking these medications         atorvastatin 20 MG tablet   Commonly known as: LIPITOR   Take 1 tablet (20 mg total) by mouth daily.      CONTINUE taking these medications         acetaminophen 500 MG tablet   Commonly known as: TYLENOL      alendronate 70 MG tablet   Commonly known as: FOSAMAX      ALIGN 4 MG Caps      aspirin EC 81 MG tablet  calcium carbonate 1250 MG tablet   Commonly known as: OS-CAL - dosed in mg of elemental calcium      cetirizine 10 MG tablet   Commonly known as: ZYRTEC      donepezil 10 MG tablet   Commonly known as: ARICEPT      EQ ACETAMINOPHEN PM PO      hydroxychloroquine 200 MG tablet   Commonly known as: PLAQUENIL      lansoprazole 15 MG capsule   Commonly known as: PREVACID      loratadine 10 MG tablet   Commonly known as: CLARITIN      metFORMIN 500 MG tablet   Commonly known as: GLUCOPHAGE   Take 1 tablet (500 mg total) by mouth 2 (two) times daily with a meal.   Start taking on: 05/01/2012      protein supplement Powd          Where to get your medications    These are the prescriptions that you need to pick up. We sent them to a specific pharmacy, so you will need to go there to get them.   [facility] - [place], [place] - [address]    [address] [place] [address]    Phone: [phone]        atorvastatin 20 MG tablet         Information on where to get these meds is not yet available. Ask your nurse or doctor.         metFORMIN 500 MG tablet           Outstanding Labs/Studies: consider LFTs in 6 weeks   Duration of Discharge Encounter: Greater than 30 minutes including  physician time.  Signed, R. Hurman Horn, PA-C 04/29/2012, 1:37 PM

## 2012-04-29 NOTE — Progress Notes (Signed)
Shelia Horn, PA walked patient in hallway after bed rest from cardiac cath.  Shelia Barber stated it is ok to now discharge patient home

## 2012-04-29 NOTE — Progress Notes (Signed)
Spoke to Hurman Horn, PA he stated that patient can be OOB at 1300.

## 2012-04-29 NOTE — CV Procedure (Signed)
      Catheterization   Indication:  Chest Pain  Procedure: After informed consent and clinical "time out" the right groin was prepped and draped in a sterile fashion.  A 5Fr sheath was placed in the right femoral artery using seldinger technique and local lidocaine.  Standard JL4, JR4 and angled pigtail catheters were used to engage the coronary arteries.  Coronary arteries were visualized in orthogonal views using caudal and cranial angulation.  RAO ventriculography was done using 30* cc of contrast.    Medications:   Versed: 2 mg's  Fentanyl: 0 ug's  Coronary Arteries: Right dominant with no anomalies  LM: Normal  LAD: 30% proximal disease normal mid and distal    D1:  Small and normal  D2  Large and normal  D3: Small and normal  Circumflex: Normal   OM1: Normal  OM2: Normal  RCA: Dominant and normal   PDA: normal  PLA: normal  Ventriculography: EF: 55 %, no RWMA;s  Hemodynamics:  Aortic Pressure: 158 85 mmHg  LV Pressure: 160 12  MmHg   Impression:  No significant CAD  RFA angiosealed at end of case with good hemostasis  Charlton Haws 04/29/2012 10:56 AM

## 2012-05-30 ENCOUNTER — Ambulatory Visit (INDEPENDENT_AMBULATORY_CARE_PROVIDER_SITE_OTHER): Payer: Medicare Other | Admitting: Physician Assistant

## 2012-05-30 ENCOUNTER — Encounter: Payer: Self-pay | Admitting: Physician Assistant

## 2012-05-30 VITALS — BP 128/82 | HR 71 | Ht 61.0 in | Wt 218.1 lb

## 2012-05-30 DIAGNOSIS — R079 Chest pain, unspecified: Secondary | ICD-10-CM

## 2012-05-30 DIAGNOSIS — I2510 Atherosclerotic heart disease of native coronary artery without angina pectoris: Secondary | ICD-10-CM

## 2012-05-30 DIAGNOSIS — E785 Hyperlipidemia, unspecified: Secondary | ICD-10-CM

## 2012-05-30 MED ORDER — LANSOPRAZOLE 30 MG PO CPDR: 30.0000 mg | DELAYED_RELEASE_CAPSULE | Freq: Every day | ORAL | Status: DC

## 2012-05-30 NOTE — Patient Instructions (Addendum)
INCREASE OTC PREVACID TO 30 MG DAILY  FOLLOW UP WITH YOUR PRIMARY CARE PHYSICIAN IN THE NEXT 4-6 WEEKS  FOLLOW UP WITH [facility] AS NEEDED

## 2012-05-30 NOTE — Progress Notes (Signed)
[address], [address] [place], [place]  [address] Phone: [phone], Fax:  [phone]  Date:  05/30/2012   Name:  Shelia Barber   DOB:  Apr 01, 1941   MRN:  [id number]  PCP:  Shelia Fuller, MD  Primary Cardiologist:  Dr.  Shawnie Pons  Primary Electrophysiologist:  None    History of Present Illness: Shelia Barber is a 72 y.o. female who returns for followup after recent admission to the hospital for chest pain.  She has a history of DM2, HTN, sleep apnea, RA, dementia. She was admitted 12/4-12/6. Patient initially presented with chest pain associated with neck and left arm pain. Cardiac markers were normal. She underwent LHC 04/29/12: Proximal LAD 30%, otherwise normal coronary arteries, EF 55%.  Since d/c, she has had one more episode of pain.  This occurred last night. It awoke her from sleep.  She had assoc dyspepsia.  No melena or hematochezia.  No dysphagia or odynophagia.  No weight loss.  She takes a PPI.  She sleeps on 2 pillows without change.  No PND.  She has minimal pedal edema.  No exertional chest pain.  She notes chronic DOE.  She is probably NYHA Class IIb.  Labs (12/13):   K 4.2, creatinine 0.66, ALT 15, LDL 112, Hgb 11.6  Wt Readings from Last 3 Encounters:  05/30/12 218 lb 1.9 oz (98.939 kg)  04/29/12 233 lb (105.688 kg)  04/29/12 233 lb (105.688 kg)     Past Medical History  Diagnosis Date  . Diabetes mellitus without complication   . Gastritis   . Anxiety   . Osteoarthritis   . Sleep apnea   . Osteoporosis   . Neuropathy   . Diverticulosis   . Migraines   . RA (rheumatoid arthritis)   . Cyst of right kidney     two   . Kidney stones   . Cataract   . CAD (coronary artery disease)     Nonobstructive on 04/2012 cath  . Hyperlipidemia     Current Outpatient Prescriptions  Medication Sig Dispense Refill  . acetaminophen (TYLENOL) 500 MG tablet Take 1,000 mg by mouth every 6 (six) hours as needed. Headache or pain      .  alendronate (FOSAMAX) 70 MG tablet Take 70 mg by mouth every 7 (seven) days. Take with a full glass of water on an empty stomach. Weekly on Monday      . aspirin EC 81 MG tablet Take 81 mg by mouth daily.      Marland Kitchen atorvastatin (LIPITOR) 20 MG tablet Take 1 tablet (20 mg total) by mouth daily.  30 tablet  3  . calcium carbonate (OS-CAL - DOSED IN MG OF ELEMENTAL CALCIUM) 1250 MG tablet Take 1 tablet by mouth 2 (two) times daily.      . cetirizine (ZYRTEC) 10 MG tablet Take 10 mg by mouth daily.      Marland Kitchen dicyclomine (BENTYL) 20 MG tablet       . Diphenhydramine-APAP, sleep, (EQ ACETAMINOPHEN PM PO) Take 1 tablet by mouth at bedtime as needed. sleep      . donepezil (ARICEPT) 10 MG tablet Take 10 mg by mouth at bedtime as needed.      Marland Kitchen escitalopram (LEXAPRO) 10 MG tablet       . gabapentin (NEURONTIN) 300 MG capsule       . hydroxychloroquine (PLAQUENIL) 200 MG tablet Take 200 mg by mouth 2 (two) times daily.      Marland Kitchen  lansoprazole (PREVACID) 15 MG capsule Take 15 mg by mouth daily.      Marland Kitchen loratadine (CLARITIN) 10 MG tablet Take 10 mg by mouth daily.      . metFORMIN (GLUCOPHAGE) 500 MG tablet Take 1 tablet (500 mg total) by mouth 2 (two) times daily with a meal.      . Probiotic Product (ALIGN) 4 MG CAPS Take 1 capsule by mouth daily.      . protein supplement (RESOURCE BENEPROTEIN) POWD Take 1 scoop by mouth 2 (two) times daily as needed. For irritable bowel symptoms        Allergies: Allergies  Allergen Reactions  . Ivp Dye (Iodinated Diagnostic Agents) Anaphylaxis  . Aspirin Other (See Comments)    Upset stomach Pt does take coated baby aspirin  . Imodium (Loperamide) Other (See Comments)    Due to Diverticulosis    Social History:  The patient  reports that she has never smoked. She does not have any smokeless tobacco history on file. She reports that she does not drink alcohol or use illicit drugs.   ROS:  Please see the history of present illness.      All other systems reviewed and  negative.   PHYSICAL EXAM: VS:  BP 128/82  Pulse 71  Ht 5\' 1"  (1.549 m)  Wt 218 lb 1.9 oz (98.939 kg)  BMI 41.21 kg/m2 Well nourished, well developed, in no acute distress HEENT: normal Neck: no JVD Cardiac:  normal S1, S2; RRR; no murmur Lungs:  clear to auscultation bilaterally, no wheezing, rhonchi or rales Abd: soft, nontender, no hepatomegaly Ext: no edema; right groin without hematoma or bruit  Skin: warm and dry Neuro:  CNs 2-12 intact, no focal abnormalities noted  EKG:  NSR, HR 70, leftward axis, septal Q waves, NSSTTW changes, no change from prior tracing.   ASSESSMENT AND PLAN:  1. Coronary Artery Disease:  She had minimal plaque on cath.  This is not the cause of her chest pain.  Continue risk factor modification.  She can remain on Lipitor and ASA.    2. Chest Pain:  Suspect this is related to GERD.  Increase Prevacid to 30 mg QD.  Follow up with PCP in 4-6 weeks.  If symptoms no better, consider referral to GI.  3. Hyperlipidemia:  Managed by PCP.   4. Disposition:  Follow up with Dr.  Shawnie Pons prn.    Signed, Tereso Newcomer, PA-C  2:58 PM 05/30/2012

## 2012-10-04 ENCOUNTER — Other Ambulatory Visit: Payer: Self-pay

## 2012-10-04 MED ORDER — ATORVASTATIN CALCIUM 20 MG PO TABS: 20.0000 mg | ORAL_TABLET | Freq: Every day | ORAL | Status: DC

## 2013-03-14 ENCOUNTER — Other Ambulatory Visit: Payer: Self-pay

## 2013-03-14 MED ORDER — ATORVASTATIN CALCIUM 20 MG PO TABS: 20.0000 mg | ORAL_TABLET | Freq: Every day | ORAL | Status: DC

## 2013-06-19 ENCOUNTER — Other Ambulatory Visit: Payer: Self-pay | Admitting: Cardiovascular Disease

## 2013-10-05 ENCOUNTER — Ambulatory Visit: Payer: Medicare Other | Admitting: Physical Therapy

## 2013-10-09 ENCOUNTER — Ambulatory Visit: Payer: Medicare Other | Attending: Rheumatology | Admitting: Physical Therapy

## 2013-10-09 DIAGNOSIS — M25619 Stiffness of unspecified shoulder, not elsewhere classified: Secondary | ICD-10-CM | POA: Diagnosis not present

## 2013-10-09 DIAGNOSIS — M25519 Pain in unspecified shoulder: Secondary | ICD-10-CM | POA: Insufficient documentation

## 2013-10-09 DIAGNOSIS — IMO0001 Reserved for inherently not codable concepts without codable children: Secondary | ICD-10-CM | POA: Diagnosis present

## 2013-10-09 DIAGNOSIS — I10 Essential (primary) hypertension: Secondary | ICD-10-CM | POA: Diagnosis not present

## 2013-10-09 DIAGNOSIS — E119 Type 2 diabetes mellitus without complications: Secondary | ICD-10-CM | POA: Diagnosis not present

## 2013-10-09 DIAGNOSIS — R609 Edema, unspecified: Secondary | ICD-10-CM | POA: Insufficient documentation

## 2013-10-09 DIAGNOSIS — M810 Age-related osteoporosis without current pathological fracture: Secondary | ICD-10-CM | POA: Insufficient documentation

## 2013-10-11 ENCOUNTER — Ambulatory Visit: Payer: Medicare Other | Admitting: Rehabilitation

## 2013-10-11 DIAGNOSIS — IMO0001 Reserved for inherently not codable concepts without codable children: Secondary | ICD-10-CM | POA: Diagnosis not present

## 2013-10-18 ENCOUNTER — Ambulatory Visit: Payer: Medicare Other | Admitting: Physical Therapy

## 2013-10-18 DIAGNOSIS — IMO0001 Reserved for inherently not codable concepts without codable children: Secondary | ICD-10-CM | POA: Diagnosis not present

## 2013-10-23 ENCOUNTER — Ambulatory Visit: Payer: Medicare Other | Admitting: Physical Therapy

## 2013-10-26 ENCOUNTER — Ambulatory Visit: Payer: Medicare Other | Attending: Rheumatology | Admitting: Rehabilitation

## 2013-10-26 DIAGNOSIS — M25519 Pain in unspecified shoulder: Secondary | ICD-10-CM | POA: Diagnosis not present

## 2013-10-26 DIAGNOSIS — IMO0001 Reserved for inherently not codable concepts without codable children: Secondary | ICD-10-CM | POA: Insufficient documentation

## 2013-10-26 DIAGNOSIS — R609 Edema, unspecified: Secondary | ICD-10-CM | POA: Insufficient documentation

## 2013-10-26 DIAGNOSIS — E119 Type 2 diabetes mellitus without complications: Secondary | ICD-10-CM | POA: Diagnosis not present

## 2013-10-26 DIAGNOSIS — M810 Age-related osteoporosis without current pathological fracture: Secondary | ICD-10-CM | POA: Diagnosis not present

## 2013-10-26 DIAGNOSIS — M25619 Stiffness of unspecified shoulder, not elsewhere classified: Secondary | ICD-10-CM | POA: Diagnosis not present

## 2013-10-26 DIAGNOSIS — I10 Essential (primary) hypertension: Secondary | ICD-10-CM | POA: Diagnosis not present

## 2013-10-30 ENCOUNTER — Ambulatory Visit: Payer: Medicare Other | Admitting: Rehabilitation

## 2013-10-30 DIAGNOSIS — IMO0001 Reserved for inherently not codable concepts without codable children: Secondary | ICD-10-CM | POA: Diagnosis not present

## 2013-11-02 ENCOUNTER — Ambulatory Visit: Payer: Medicare Other | Admitting: Rehabilitation

## 2013-11-02 DIAGNOSIS — IMO0001 Reserved for inherently not codable concepts without codable children: Secondary | ICD-10-CM | POA: Diagnosis not present

## 2013-11-06 ENCOUNTER — Ambulatory Visit: Payer: Medicare Other | Admitting: Physical Therapy

## 2013-11-09 ENCOUNTER — Ambulatory Visit: Payer: Medicare Other | Admitting: Rehabilitation

## 2013-11-13 ENCOUNTER — Ambulatory Visit: Payer: Medicare Other | Admitting: Rehabilitation

## 2013-11-13 DIAGNOSIS — IMO0001 Reserved for inherently not codable concepts without codable children: Secondary | ICD-10-CM | POA: Diagnosis not present

## 2013-11-16 ENCOUNTER — Ambulatory Visit: Payer: Medicare Other | Admitting: Rehabilitation

## 2013-11-16 DIAGNOSIS — IMO0001 Reserved for inherently not codable concepts without codable children: Secondary | ICD-10-CM | POA: Diagnosis not present

## 2013-11-21 ENCOUNTER — Ambulatory Visit: Payer: Medicare Other | Admitting: Rehabilitation

## 2013-11-21 DIAGNOSIS — IMO0001 Reserved for inherently not codable concepts without codable children: Secondary | ICD-10-CM | POA: Diagnosis not present

## 2013-11-23 ENCOUNTER — Ambulatory Visit: Payer: Medicare Other | Attending: Rheumatology | Admitting: Rehabilitation

## 2013-11-23 DIAGNOSIS — IMO0001 Reserved for inherently not codable concepts without codable children: Secondary | ICD-10-CM | POA: Diagnosis present

## 2013-11-23 DIAGNOSIS — R609 Edema, unspecified: Secondary | ICD-10-CM | POA: Insufficient documentation

## 2013-11-23 DIAGNOSIS — M25519 Pain in unspecified shoulder: Secondary | ICD-10-CM | POA: Diagnosis not present

## 2013-11-23 DIAGNOSIS — I10 Essential (primary) hypertension: Secondary | ICD-10-CM | POA: Insufficient documentation

## 2013-11-23 DIAGNOSIS — M810 Age-related osteoporosis without current pathological fracture: Secondary | ICD-10-CM | POA: Diagnosis not present

## 2013-11-23 DIAGNOSIS — M25619 Stiffness of unspecified shoulder, not elsewhere classified: Secondary | ICD-10-CM | POA: Insufficient documentation

## 2013-11-23 DIAGNOSIS — E119 Type 2 diabetes mellitus without complications: Secondary | ICD-10-CM | POA: Diagnosis not present

## 2013-11-27 ENCOUNTER — Ambulatory Visit: Payer: Medicare Other | Admitting: Rehabilitation

## 2013-11-27 DIAGNOSIS — IMO0001 Reserved for inherently not codable concepts without codable children: Secondary | ICD-10-CM | POA: Diagnosis not present

## 2013-11-30 ENCOUNTER — Ambulatory Visit: Payer: Medicare Other | Admitting: Rehabilitation

## 2014-03-16 ENCOUNTER — Encounter (HOSPITAL_COMMUNITY): Payer: Self-pay | Admitting: Pharmacy Technician

## 2014-03-20 NOTE — H&P (Signed)
Shelia Barber is an 73 y.o. female.    Chief Complaint: left shoulder pain  HPI: Pt is a 73 y.o. female complaining of left shoulder pain for multiple years. Pain had continually increased since the beginning. X-rays in the clinic show end-stage arthritic changes of the left shoulder. Pt has tried various conservative treatments which have failed to alleviate their symptoms, including injections and therapy. Various options are discussed with the patient. Risks, benefits and expectations were discussed with the patient. Patient understand the risks, benefits and expectations and wishes to proceed with surgery.   PCP:  Tarri Fuller, MD  D/C Plans:  Home with HHPT  PMH: Past Medical History  Diagnosis Date  . Diabetes mellitus without complication   . Gastritis   . Anxiety   . Osteoarthritis   . Sleep apnea   . Osteoporosis   . Neuropathy   . Diverticulosis   . Migraines   . RA (rheumatoid arthritis)   . Cyst of right kidney     two   . Kidney stones   . Cataract   . CAD (coronary artery disease)     Nonobstructive on 04/2012 cath  . Hyperlipidemia     PSH: Past Surgical History  Procedure Laterality Date  . Cesarean section    . Appendectomy    . Abdominal hysterectomy      parcel  . Cholecystectomy    . Joint replacement      left knee  . Cardiac catheterization  04/29/2012    30% proximal LAD lesion, otherwise normal coronaries; LVEF 55% and no WMAs     Social History:  reports that she has never smoked. She does not have any smokeless tobacco history on file. She reports that she does not drink alcohol or use illicit drugs.  Allergies:  Allergies  Allergen Reactions  . Ivp Dye [Iodinated Diagnostic Agents] Anaphylaxis  . Aspirin Other (See Comments)    Upset stomach Pt does take coated baby aspirin  . Imodium [Loperamide] Other (See Comments)    Due to Diverticulosis    Medications: No current facility-administered medications for this encounter.    Current Outpatient Prescriptions  Medication Sig Dispense Refill  . atorvastatin (LIPITOR) 20 MG tablet Take 20 mg by mouth daily.      . cetirizine (ZYRTEC) 10 MG tablet Take 10 mg by mouth daily.      . clopidogrel (PLAVIX) 75 MG tablet Take 75 mg by mouth daily.      Marland Kitchen gabapentin (NEURONTIN) 300 MG capsule Take 300 mg by mouth 4 (four) times daily.       Marland Kitchen glimepiride (AMARYL) 2 MG tablet Take 2 mg by mouth 2 (two) times daily.      . hydroxychloroquine (PLAQUENIL) 200 MG tablet Take 200 mg by mouth 2 (two) times daily.      Marland Kitchen inFLIXimab (REMICADE) 100 MG injection Inject 100 mg into the vein every 8 (eight) weeks.      . lansoprazole (PREVACID) 15 MG capsule Take 15 mg by mouth 3 (three) times daily.      Marland Kitchen loratadine (CLARITIN) 10 MG tablet Take 10 mg by mouth daily.      Marland Kitchen losartan (COZAAR) 25 MG tablet Take 25 mg by mouth daily.      . methocarbamol (ROBAXIN) 500 MG tablet Take 500 mg by mouth 2 (two) times daily.      . Probiotic Product (PROBIOTIC DAILY PO) Take 1 capsule by mouth daily. Equate Brand Probiotic      .  ranitidine (ZANTAC) 150 MG tablet Take 150 mg by mouth daily.        No results found for this or any previous visit (from the past 48 hour(s)). No results found.  ROS: Pain with rom of the left upper extremity  Physical Exam:  Alert and oriented 73 y.o. female in no acute distress Cranial nerves 2-12 intact Cervical spine: full rom with no tenderness, nv intact distally Chest: active breath sounds bilaterally, no wheeze rhonchi or rales Heart: regular rate and rhythm, no murmur Abd: non tender non distended with active bowel sounds Hip is stable with rom  Left shoulder with moderate limitation with rom nv intact distally No rashes or edema Strength is 4/5 with ER and IR  Assessment/Plan Assessment: left shoulder end stage osteoarthritis  Plan: Patient will undergo a left shoulder total vs reverse total arthroplasty by Dr. Ranell Patrick at [facility].  Risks benefits and expectations were discussed with the patient. Patient understand risks, benefits and expectations and wishes to proceed.

## 2014-03-21 NOTE — Pre-Procedure Instructions (Signed)
Shelia Barber  03/21/2014   Your procedure is scheduled on:  Fri, Nov 6 @ 7:30 AM  Report to [facility]  at 5:30 AM.  Call this number if you have problems the morning of surgery: [phone]   Remember:   Do not eat food or drink liquids after midnight.   Take these medicines the morning of surgery with A SIP OF WATER: Zyrtec(Cetirizine),Gabapentin(Neurontin),Prevacid(Lansoprazole),Claritin(Loratadine),and Zantac(Ranitidine)               Stop taking your Plavix 5 days prior to surgery. No Goody's,BC's,Aleve,Aspirin,Ibuprofen,Fish Oil,or any Herbal Medications   Do not wear jewelry, make-up or nail polish.  Do not wear lotions, powders, or perfumes. You may wear deodorant.  Do not shave 48 hours prior to surgery.  Do not bring valuables to the hospital.  [facility] is not responsible                  for any belongings or valuables.               Contacts, dentures or bridgework may not be worn into surgery.  Leave suitcase in the car. After surgery it may be brought to your room.  For patients admitted to the hospital, discharge time is determined by your                treatment team.               Patients discharged the day of surgery will not be allowed to drive  home.    Special Instructions:  [place] - Preparing for Surgery  Before surgery, you can play an important role.  Because skin is not sterile, your skin needs to be as free of germs as possible.  You can reduce the number of germs on you skin by washing with CHG (chlorahexidine gluconate) soap before surgery.  CHG is an antiseptic cleaner which kills germs and bonds with the skin to continue killing germs even after washing.  Please DO NOT use if you have an allergy to CHG or antibacterial soaps.  If your skin becomes reddened/irritated stop using the CHG and inform your nurse when you arrive at Short Stay.  Do not shave (including legs and underarms) for at least 48 hours prior to the first CHG shower.   You may shave your face.  Please follow these instructions carefully:   1.  Shower with CHG Soap the night before surgery and the                                morning of Surgery.  2.  If you choose to wash your hair, wash your hair first as usual with your       normal shampoo.  3.  After you shampoo, rinse your hair and body thoroughly to remove the                      Shampoo.  4.  Use CHG as you would any other liquid soap.  You can apply chg directly       to the skin and wash gently with scrungie or a clean washcloth.  5.  Apply the CHG Soap to your body ONLY FROM THE NECK DOWN.        Do not use on open wounds or open sores.  Avoid contact with your eyes,  ears, mouth and genitals (private parts).  Wash genitals (private parts)       with your normal soap.  6.  Wash thoroughly, paying special attention to the area where your surgery        will be performed.  7.  Thoroughly rinse your body with warm water from the neck down.  8.  DO NOT shower/wash with your normal soap after using and rinsing off       the CHG Soap.  9.  Pat yourself dry with a clean towel.            10.  Wear clean pajamas.            11.  Place clean sheets on your bed the night of your first shower and do not        sleep with pets.  Day of Surgery  Do not apply any lotions/deoderants the morning of surgery.  Please wear clean clothes to the hospital/surgery center.     Please read over the following fact sheets that you were given: Pain Booklet, Coughing and Deep Breathing and Surgical Site Infection Prevention

## 2014-03-22 ENCOUNTER — Encounter (HOSPITAL_COMMUNITY): Payer: Self-pay

## 2014-03-22 ENCOUNTER — Encounter (HOSPITAL_COMMUNITY)
Admission: RE | Admit: 2014-03-22 | Discharge: 2014-03-22 | Disposition: A | Discharge status: Home / Self Care | Payer: Medicare Other | Source: Ambulatory Visit | Attending: Orthopedic Surgery | Admitting: Orthopedic Surgery

## 2014-03-22 DIAGNOSIS — E785 Hyperlipidemia, unspecified: Secondary | ICD-10-CM | POA: Insufficient documentation

## 2014-03-22 DIAGNOSIS — Z8673 Personal history of transient ischemic attack (TIA), and cerebral infarction without residual deficits: Secondary | ICD-10-CM | POA: Insufficient documentation

## 2014-03-22 DIAGNOSIS — G4733 Obstructive sleep apnea (adult) (pediatric): Secondary | ICD-10-CM | POA: Insufficient documentation

## 2014-03-22 DIAGNOSIS — M810 Age-related osteoporosis without current pathological fracture: Secondary | ICD-10-CM | POA: Insufficient documentation

## 2014-03-22 DIAGNOSIS — E119 Type 2 diabetes mellitus without complications: Secondary | ICD-10-CM | POA: Diagnosis not present

## 2014-03-22 DIAGNOSIS — G629 Polyneuropathy, unspecified: Secondary | ICD-10-CM | POA: Insufficient documentation

## 2014-03-22 DIAGNOSIS — M1990 Unspecified osteoarthritis, unspecified site: Secondary | ICD-10-CM | POA: Insufficient documentation

## 2014-03-22 DIAGNOSIS — M069 Rheumatoid arthritis, unspecified: Secondary | ICD-10-CM | POA: Diagnosis not present

## 2014-03-22 DIAGNOSIS — Z9049 Acquired absence of other specified parts of digestive tract: Secondary | ICD-10-CM | POA: Diagnosis not present

## 2014-03-22 DIAGNOSIS — K449 Diaphragmatic hernia without obstruction or gangrene: Secondary | ICD-10-CM | POA: Diagnosis not present

## 2014-03-22 DIAGNOSIS — Z981 Arthrodesis status: Secondary | ICD-10-CM | POA: Diagnosis not present

## 2014-03-22 DIAGNOSIS — I2510 Atherosclerotic heart disease of native coronary artery without angina pectoris: Secondary | ICD-10-CM | POA: Insufficient documentation

## 2014-03-22 DIAGNOSIS — G43909 Migraine, unspecified, not intractable, without status migrainosus: Secondary | ICD-10-CM | POA: Insufficient documentation

## 2014-03-22 DIAGNOSIS — I10 Essential (primary) hypertension: Secondary | ICD-10-CM | POA: Diagnosis not present

## 2014-03-22 DIAGNOSIS — N281 Cyst of kidney, acquired: Secondary | ICD-10-CM | POA: Insufficient documentation

## 2014-03-22 DIAGNOSIS — Z01818 Encounter for other preprocedural examination: Secondary | ICD-10-CM | POA: Insufficient documentation

## 2014-03-22 DIAGNOSIS — Z7901 Long term (current) use of anticoagulants: Secondary | ICD-10-CM | POA: Diagnosis not present

## 2014-03-22 DIAGNOSIS — Z90710 Acquired absence of both cervix and uterus: Secondary | ICD-10-CM | POA: Diagnosis not present

## 2014-03-22 DIAGNOSIS — K219 Gastro-esophageal reflux disease without esophagitis: Secondary | ICD-10-CM | POA: Diagnosis not present

## 2014-03-22 HISTORY — DX: Other specified postprocedural states: Z98.890

## 2014-03-22 HISTORY — DX: Frequency of micturition: R35.0

## 2014-03-22 HISTORY — DX: Gastro-esophageal reflux disease without esophagitis: K21.9

## 2014-03-22 HISTORY — DX: Effusion, unspecified joint: M25.40

## 2014-03-22 HISTORY — DX: Transient cerebral ischemic attack, unspecified: G45.9

## 2014-03-22 HISTORY — DX: Personal history of colon polyps, unspecified: Z86.0100

## 2014-03-22 HISTORY — DX: Cyst of kidney, acquired: N28.1

## 2014-03-22 HISTORY — DX: Pain in unspecified joint: M25.50

## 2014-03-22 HISTORY — DX: Personal history of colonic polyps: Z86.010

## 2014-03-22 HISTORY — DX: Other chronic pain: G89.29

## 2014-03-22 HISTORY — DX: Polyneuropathy, unspecified: G62.9

## 2014-03-22 HISTORY — DX: Urgency of urination: R39.15

## 2014-03-22 HISTORY — DX: Essential (primary) hypertension: I10

## 2014-03-22 HISTORY — DX: Dorsalgia, unspecified: M54.9

## 2014-03-22 HISTORY — DX: Personal history of other diseases of the digestive system: Z87.19

## 2014-03-22 HISTORY — DX: Other seasonal allergic rhinitis: J30.2

## 2014-03-22 LAB — CBC
HCT: 39.4 % (ref 36.0–46.0)
Hemoglobin: 12.5 g/dL (ref 12.0–15.0)
MCH: 28.1 pg (ref 26.0–34.0)
MCHC: 31.7 g/dL (ref 30.0–36.0)
MCV: 88.5 fL (ref 78.0–100.0)
Platelets: 184 10*3/uL (ref 150–400)
RBC: 4.45 MIL/uL (ref 3.87–5.11)
RDW: 14.8 % (ref 11.5–15.5)
WBC: 7.2 10*3/uL (ref 4.0–10.5)

## 2014-03-22 LAB — BASIC METABOLIC PANEL
Anion gap: 11 (ref 5–15)
BUN: 9 mg/dL (ref 6–23)
CO2: 27 mEq/L (ref 19–32)
Calcium: 9.3 mg/dL (ref 8.4–10.5)
Chloride: 105 mEq/L (ref 96–112)
Creatinine, Ser: 0.65 mg/dL (ref 0.50–1.10)
GFR, EST NON AFRICAN AMERICAN: 87 mL/min — AB (ref >90)
GLUCOSE: 47 mg/dL — AB (ref 70–99)
POTASSIUM: 4.2 meq/L (ref 3.7–5.3)
SODIUM: 143 meq/L (ref 137–147)

## 2014-03-22 MED ORDER — CHLORHEXIDINE GLUCONATE 4 % EX LIQD: 60.0000 mL | Freq: Once | CUTANEOUS | Status: DC

## 2014-03-22 NOTE — Progress Notes (Signed)
Sleep study done around early 2000's then redone a few months ago-to request from [place] on [address]

## 2014-03-22 NOTE — Progress Notes (Addendum)
Dr.Folk at [facility]-saw him about 3-3months ago and to follow up with him again on Mon, Nov 2  Thinks she has had an echo but not sure-to request from [facility]  Stress test done at least 75yrs ago  Heart cath in epic from 2013  Medical Md is Dr.Richard Watt Climes  Knows she has had an ekg but unsure if done at [facility] or Dr.Folk office-to request Thinks she has had a CXR in past yr

## 2014-03-23 NOTE — Progress Notes (Signed)
Anesthesia Chart Review:  Patient is a 73 year old female scheduled for left total shoulder versus reverse total shoulder arthroplasty on 03/30/14 by Dr. Ranell Patrick.    History includes non-smoker, OSA, DM2, neuropathy, HLD, RA,OA, HTN, GERD, TIA (on Plavix), migraines, hiatal hernia, right renal cyst, osteoporosis, mild non-obstructive CAD by 2013 cath, left TKA, appendectomy, hysterectomy, cholecystectomy, C4-5 fusion.  BMI is consistent with morbid obesity. PCP is Dr. Tarri Fuller who medically cleared patient with permission to hold Plavix 5 days prior to surgery.  Cardiologist is Dr. Judithe Modest ([facility]), last visit 09/05/13.    Meds include Lipitor, Plavix, Neurontin, Amaryl, Plaquenil, Remicate (last does 02/19/14), Cozaar, Robaxin, Zantac.  EKG on 08/09/13 ([facility]): NSR, low voltage QRS.  Echo 08/10/13 ([facility]): Borderline concentric LVH. Normal LV systolic function, EF visually estimated at 55-60%. Mild aortic sclerosis without stenosis or regurgitation. Mild left atrial enlargement. Normal RV size and function.  Carotid duplex 08/10/13 ([facility]): 1-39% stenosis bilateral ICA.  Cardiac cath 04/29/12 [facility] ([place])): Right dominant. 30% proximal LAD otherwise normal coronary arteries. EF 55% with no regional wall motion abnormalities.  1V CXR 08/09/13 ([facility]): Borderline heart size. Slight elevation of the left hemidiaphragm. No significant opacities or effusion. No acute bony abnormality. Degenerative changes in the shoulders. No active disease.  Preoperative labs noted.    If no acute changes then anticipate that she can proceed as planned.  Velna Ochs [facility] Short Stay Center/Anesthesiology Phone [phone] 03/23/2014 10:29 AM

## 2014-03-29 MED ORDER — CEFAZOLIN SODIUM-DEXTROSE 2-3 GM-% IV SOLR
2.0000 g | INTRAVENOUS | Status: AC
  Administered 2014-03-30: 2 g via INTRAVENOUS
  Filled 2014-03-29: qty 50

## 2014-03-30 ENCOUNTER — Inpatient Hospital Stay (HOSPITAL_COMMUNITY): Payer: Medicare Other

## 2014-03-30 ENCOUNTER — Inpatient Hospital Stay (HOSPITAL_COMMUNITY): Payer: Medicare Other | Admitting: Certified Registered"

## 2014-03-30 ENCOUNTER — Inpatient Hospital Stay (HOSPITAL_COMMUNITY): Payer: Medicare Other | Admitting: Vascular Surgery

## 2014-03-30 ENCOUNTER — Encounter (HOSPITAL_COMMUNITY): Payer: Self-pay | Admitting: *Deleted

## 2014-03-30 ENCOUNTER — Inpatient Hospital Stay (HOSPITAL_COMMUNITY)
Admission: RE | Admit: 2014-03-30 | Discharge: 2014-04-03 | DRG: 483 | Disposition: A | Discharge status: Skilled Nursing Facility | Payer: Medicare Other | Source: Ambulatory Visit | Attending: Orthopedic Surgery | Admitting: Orthopedic Surgery

## 2014-03-30 ENCOUNTER — Encounter (HOSPITAL_COMMUNITY)
Admission: RE | Disposition: A | Discharge status: Skilled Nursing Facility | Payer: Self-pay | Source: Ambulatory Visit | Attending: Orthopedic Surgery

## 2014-03-30 DIAGNOSIS — K219 Gastro-esophageal reflux disease without esophagitis: Secondary | ICD-10-CM | POA: Diagnosis present

## 2014-03-30 DIAGNOSIS — Z79899 Other long term (current) drug therapy: Secondary | ICD-10-CM | POA: Diagnosis not present

## 2014-03-30 DIAGNOSIS — M069 Rheumatoid arthritis, unspecified: Secondary | ICD-10-CM | POA: Diagnosis present

## 2014-03-30 DIAGNOSIS — G629 Polyneuropathy, unspecified: Secondary | ICD-10-CM | POA: Diagnosis present

## 2014-03-30 DIAGNOSIS — Z7902 Long term (current) use of antithrombotics/antiplatelets: Secondary | ICD-10-CM | POA: Diagnosis not present

## 2014-03-30 DIAGNOSIS — Z8673 Personal history of transient ischemic attack (TIA), and cerebral infarction without residual deficits: Secondary | ICD-10-CM

## 2014-03-30 DIAGNOSIS — M810 Age-related osteoporosis without current pathological fracture: Secondary | ICD-10-CM | POA: Diagnosis present

## 2014-03-30 DIAGNOSIS — Z96652 Presence of left artificial knee joint: Secondary | ICD-10-CM | POA: Diagnosis present

## 2014-03-30 DIAGNOSIS — E119 Type 2 diabetes mellitus without complications: Secondary | ICD-10-CM | POA: Diagnosis present

## 2014-03-30 DIAGNOSIS — Z886 Allergy status to analgesic agent status: Secondary | ICD-10-CM | POA: Diagnosis not present

## 2014-03-30 DIAGNOSIS — Z91041 Radiographic dye allergy status: Secondary | ICD-10-CM

## 2014-03-30 DIAGNOSIS — Z96619 Presence of unspecified artificial shoulder joint: Secondary | ICD-10-CM

## 2014-03-30 DIAGNOSIS — Z96611 Presence of right artificial shoulder joint: Secondary | ICD-10-CM

## 2014-03-30 DIAGNOSIS — M19012 Primary osteoarthritis, left shoulder: Principal | ICD-10-CM | POA: Diagnosis present

## 2014-03-30 DIAGNOSIS — Z888 Allergy status to other drugs, medicaments and biological substances status: Secondary | ICD-10-CM

## 2014-03-30 DIAGNOSIS — Z6841 Body Mass Index (BMI) 40.0 and over, adult: Secondary | ICD-10-CM | POA: Diagnosis not present

## 2014-03-30 DIAGNOSIS — I10 Essential (primary) hypertension: Secondary | ICD-10-CM | POA: Diagnosis present

## 2014-03-30 DIAGNOSIS — E785 Hyperlipidemia, unspecified: Secondary | ICD-10-CM | POA: Diagnosis present

## 2014-03-30 DIAGNOSIS — I2510 Atherosclerotic heart disease of native coronary artery without angina pectoris: Secondary | ICD-10-CM | POA: Diagnosis present

## 2014-03-30 DIAGNOSIS — F419 Anxiety disorder, unspecified: Secondary | ICD-10-CM | POA: Diagnosis present

## 2014-03-30 HISTORY — PX: TOTAL SHOULDER ARTHROPLASTY: SHX126

## 2014-03-30 LAB — GLUCOSE, CAPILLARY
GLUCOSE-CAPILLARY: 103 mg/dL — AB (ref 70–99)
GLUCOSE-CAPILLARY: 143 mg/dL — AB (ref 70–99)
GLUCOSE-CAPILLARY: 157 mg/dL — AB (ref 70–99)
Glucose-Capillary: 132 mg/dL — ABNORMAL HIGH (ref 70–99)

## 2014-03-30 SURGERY — ARTHROPLASTY, SHOULDER, TOTAL
Anesthesia: Regional | Site: Shoulder | Laterality: Left

## 2014-03-30 MED ORDER — ONDANSETRON HCL 4 MG/2ML IJ SOLN: 4.0000 mg | Freq: Four times a day (QID) | INTRAMUSCULAR | Status: DC | PRN

## 2014-03-30 MED ORDER — CEFAZOLIN SODIUM-DEXTROSE 2-3 GM-% IV SOLR
2.0000 g | Freq: Four times a day (QID) | INTRAVENOUS | Status: AC
  Administered 2014-03-30 – 2014-03-31 (×3): 2 g via INTRAVENOUS
  Filled 2014-03-30 (×3): qty 50

## 2014-03-30 MED ORDER — BUPIVACAINE-EPINEPHRINE (PF) 0.5% -1:200000 IJ SOLN
INTRAMUSCULAR | Status: DC | PRN
  Administered 2014-03-30: 30 mL via PERINEURAL

## 2014-03-30 MED ORDER — LORATADINE 10 MG PO TABS: 10.0000 mg | ORAL_TABLET | Freq: Every day | ORAL | Status: DC

## 2014-03-30 MED ORDER — SODIUM CHLORIDE 0.9 % IV SOLN
INTRAVENOUS | Status: DC
  Administered 2014-03-30 – 2014-03-31 (×2): via INTRAVENOUS

## 2014-03-30 MED ORDER — FENTANYL CITRATE 0.05 MG/ML IJ SOLN
INTRAMUSCULAR | Status: AC
  Filled 2014-03-30: qty 5

## 2014-03-30 MED ORDER — LOSARTAN POTASSIUM 25 MG PO TABS
25.0000 mg | ORAL_TABLET | Freq: Every day | ORAL | Status: DC
  Administered 2014-03-31 – 2014-04-03 (×4): 25 mg via ORAL
  Filled 2014-03-30 (×5): qty 1

## 2014-03-30 MED ORDER — LIDOCAINE HCL (CARDIAC) 20 MG/ML IV SOLN
INTRAVENOUS | Status: AC
  Filled 2014-03-30: qty 5

## 2014-03-30 MED ORDER — ONDANSETRON HCL 4 MG PO TABS: 4.0000 mg | ORAL_TABLET | Freq: Four times a day (QID) | ORAL | Status: DC | PRN

## 2014-03-30 MED ORDER — OXYCODONE HCL 5 MG PO TABS: 5.0000 mg | ORAL_TABLET | Freq: Once | ORAL | Status: DC | PRN

## 2014-03-30 MED ORDER — OXYCODONE HCL 5 MG/5ML PO SOLN
5.0000 mg | Freq: Once | ORAL | Status: DC | PRN
Start: 2014-03-30 — End: 2014-03-30

## 2014-03-30 MED ORDER — PROPOFOL 10 MG/ML IV BOLUS
INTRAVENOUS | Status: DC | PRN
  Administered 2014-03-30: 130 mg via INTRAVENOUS

## 2014-03-30 MED ORDER — ONDANSETRON HCL 4 MG/2ML IJ SOLN
INTRAMUSCULAR | Status: AC
  Filled 2014-03-30: qty 2

## 2014-03-30 MED ORDER — HYDROCODONE-ACETAMINOPHEN 5-325 MG PO TABS
1.0000 | ORAL_TABLET | ORAL | Status: DC | PRN
  Administered 2014-03-31 – 2014-04-03 (×13): 2 via ORAL
  Filled 2014-03-30 (×14): qty 2

## 2014-03-30 MED ORDER — GLIMEPIRIDE 2 MG PO TABS
2.0000 mg | ORAL_TABLET | Freq: Two times a day (BID) | ORAL | Status: DC
  Administered 2014-03-30 – 2014-04-03 (×8): 2 mg via ORAL
  Filled 2014-03-30 (×10): qty 1

## 2014-03-30 MED ORDER — INSULIN ASPART 100 UNIT/ML ~~LOC~~ SOLN
0.0000 [IU] | Freq: Three times a day (TID) | SUBCUTANEOUS | Status: DC
  Administered 2014-03-30 – 2014-04-02 (×7): 2 [IU] via SUBCUTANEOUS
  Administered 2014-04-03: 1 [IU] via SUBCUTANEOUS

## 2014-03-30 MED ORDER — FENTANYL CITRATE 0.05 MG/ML IJ SOLN
INTRAMUSCULAR | Status: DC | PRN
  Administered 2014-03-30 (×2): 50 ug via INTRAVENOUS

## 2014-03-30 MED ORDER — NEOSTIGMINE METHYLSULFATE 10 MG/10ML IV SOLN
INTRAVENOUS | Status: DC | PRN
  Administered 2014-03-30: 3 mg via INTRAVENOUS

## 2014-03-30 MED ORDER — 0.9 % SODIUM CHLORIDE (POUR BTL) OPTIME
TOPICAL | Status: DC | PRN
  Administered 2014-03-30: 1000 mL

## 2014-03-30 MED ORDER — PHENOL 1.4 % MT LIQD: 1.0000 | OROMUCOSAL | Status: DC | PRN

## 2014-03-30 MED ORDER — GLYCOPYRROLATE 0.2 MG/ML IJ SOLN
INTRAMUSCULAR | Status: DC | PRN
  Administered 2014-03-30: 0.4 mg via INTRAVENOUS

## 2014-03-30 MED ORDER — MIDAZOLAM HCL 5 MG/5ML IJ SOLN
INTRAMUSCULAR | Status: DC | PRN
  Administered 2014-03-30: 1 mg via INTRAVENOUS

## 2014-03-30 MED ORDER — THROMBIN 5000 UNITS EX SOLR
CUTANEOUS | Status: AC
  Filled 2014-03-30: qty 5000

## 2014-03-30 MED ORDER — CLOPIDOGREL BISULFATE 75 MG PO TABS
75.0000 mg | ORAL_TABLET | Freq: Every day | ORAL | Status: DC
  Administered 2014-03-31 – 2014-04-03 (×4): 75 mg via ORAL
  Filled 2014-03-30 (×5): qty 1

## 2014-03-30 MED ORDER — BUPIVACAINE-EPINEPHRINE 0.25% -1:200000 IJ SOLN
INTRAMUSCULAR | Status: DC | PRN
  Administered 2014-03-30: 9 mL

## 2014-03-30 MED ORDER — NEOSTIGMINE METHYLSULFATE 10 MG/10ML IV SOLN
INTRAVENOUS | Status: AC
  Filled 2014-03-30: qty 1

## 2014-03-30 MED ORDER — BUPIVACAINE-EPINEPHRINE (PF) 0.25% -1:200000 IJ SOLN
INTRAMUSCULAR | Status: AC
  Filled 2014-03-30: qty 30

## 2014-03-30 MED ORDER — DEXAMETHASONE SODIUM PHOSPHATE 4 MG/ML IJ SOLN
INTRAMUSCULAR | Status: AC
  Filled 2014-03-30: qty 1

## 2014-03-30 MED ORDER — SODIUM CHLORIDE 0.9 % IV SOLN
10.0000 mg | INTRAVENOUS | Status: DC | PRN
  Administered 2014-03-30: 25 ug/min via INTRAVENOUS

## 2014-03-30 MED ORDER — METOCLOPRAMIDE HCL 10 MG PO TABS: 5.0000 mg | ORAL_TABLET | Freq: Three times a day (TID) | ORAL | Status: DC | PRN

## 2014-03-30 MED ORDER — GABAPENTIN 300 MG PO CAPS
300.0000 mg | ORAL_CAPSULE | Freq: Four times a day (QID) | ORAL | Status: DC
  Administered 2014-03-30 – 2014-04-03 (×16): 300 mg via ORAL
  Filled 2014-03-30 (×19): qty 1

## 2014-03-30 MED ORDER — FENTANYL CITRATE 0.05 MG/ML IJ SOLN: 25.0000 ug | INTRAMUSCULAR | Status: DC | PRN

## 2014-03-30 MED ORDER — PANTOPRAZOLE SODIUM 40 MG PO TBEC
40.0000 mg | DELAYED_RELEASE_TABLET | Freq: Every day | ORAL | Status: DC
  Administered 2014-03-31 – 2014-04-03 (×4): 40 mg via ORAL
  Filled 2014-03-30 (×3): qty 1

## 2014-03-30 MED ORDER — LORATADINE 10 MG PO TABS
10.0000 mg | ORAL_TABLET | Freq: Every day | ORAL | Status: DC
  Administered 2014-03-31 – 2014-04-03 (×4): 10 mg via ORAL
  Filled 2014-03-30 (×5): qty 1

## 2014-03-30 MED ORDER — MENTHOL 3 MG MT LOZG: 1.0000 | LOZENGE | OROMUCOSAL | Status: DC | PRN

## 2014-03-30 MED ORDER — SUCCINYLCHOLINE CHLORIDE 20 MG/ML IJ SOLN
INTRAMUSCULAR | Status: AC
  Filled 2014-03-30: qty 1

## 2014-03-30 MED ORDER — INFLIXIMAB 100 MG IV SOLR: 100.0000 mg | INTRAVENOUS | Status: DC

## 2014-03-30 MED ORDER — MORPHINE SULFATE 2 MG/ML IJ SOLN
1.0000 mg | INTRAMUSCULAR | Status: DC | PRN
  Administered 2014-03-30: 2 mg via INTRAVENOUS
  Filled 2014-03-30: qty 1

## 2014-03-30 MED ORDER — LACTATED RINGERS IV SOLN
INTRAVENOUS | Status: DC | PRN
  Administered 2014-03-30: 07:00:00 via INTRAVENOUS

## 2014-03-30 MED ORDER — GLYCOPYRROLATE 0.2 MG/ML IJ SOLN
INTRAMUSCULAR | Status: AC
  Filled 2014-03-30: qty 2

## 2014-03-30 MED ORDER — METOCLOPRAMIDE HCL 5 MG/ML IJ SOLN: 5.0000 mg | Freq: Three times a day (TID) | INTRAMUSCULAR | Status: DC | PRN

## 2014-03-30 MED ORDER — FAMOTIDINE 20 MG PO TABS
20.0000 mg | ORAL_TABLET | Freq: Two times a day (BID) | ORAL | Status: DC
Start: 2014-03-30 — End: 2014-04-03
  Administered 2014-03-30 – 2014-04-03 (×8): 20 mg via ORAL
  Filled 2014-03-30 (×10): qty 1

## 2014-03-30 MED ORDER — ATORVASTATIN CALCIUM 20 MG PO TABS
20.0000 mg | ORAL_TABLET | Freq: Every day | ORAL | Status: DC
  Administered 2014-03-31 – 2014-04-03 (×4): 20 mg via ORAL
  Filled 2014-03-30 (×5): qty 1

## 2014-03-30 MED ORDER — DEXAMETHASONE SODIUM PHOSPHATE 4 MG/ML IJ SOLN
INTRAMUSCULAR | Status: DC | PRN
  Administered 2014-03-30: 4 mg via INTRAVENOUS

## 2014-03-30 MED ORDER — PROPOFOL 10 MG/ML IV BOLUS
INTRAVENOUS | Status: AC
  Filled 2014-03-30: qty 20

## 2014-03-30 MED ORDER — METHOCARBAMOL 500 MG PO TABS
500.0000 mg | ORAL_TABLET | Freq: Two times a day (BID) | ORAL | Status: DC
  Administered 2014-03-30 – 2014-04-03 (×8): 500 mg via ORAL
  Filled 2014-03-30 (×8): qty 1

## 2014-03-30 MED ORDER — ONDANSETRON HCL 4 MG/2ML IJ SOLN
INTRAMUSCULAR | Status: DC | PRN
  Administered 2014-03-30: 4 mg via INTRAVENOUS

## 2014-03-30 MED ORDER — MIDAZOLAM HCL 2 MG/2ML IJ SOLN
INTRAMUSCULAR | Status: AC
  Filled 2014-03-30: qty 2

## 2014-03-30 MED ORDER — HYDROXYCHLOROQUINE SULFATE 200 MG PO TABS
200.0000 mg | ORAL_TABLET | Freq: Two times a day (BID) | ORAL | Status: DC
  Administered 2014-03-30 – 2014-04-03 (×9): 200 mg via ORAL
  Filled 2014-03-30 (×10): qty 1

## 2014-03-30 MED ORDER — HYDROCODONE-ACETAMINOPHEN 5-325 MG PO TABS: 1.0000 | ORAL_TABLET | Freq: Four times a day (QID) | ORAL | Status: DC | PRN

## 2014-03-30 MED ORDER — ROCURONIUM BROMIDE 100 MG/10ML IV SOLN
INTRAVENOUS | Status: DC | PRN
  Administered 2014-03-30: 50 mg via INTRAVENOUS

## 2014-03-30 MED ORDER — ACETAMINOPHEN 325 MG PO TABS
650.0000 mg | ORAL_TABLET | Freq: Four times a day (QID) | ORAL | Status: DC | PRN
  Filled 2014-03-30: qty 2

## 2014-03-30 MED ORDER — ROCURONIUM BROMIDE 50 MG/5ML IV SOLN
INTRAVENOUS | Status: AC
  Filled 2014-03-30: qty 1

## 2014-03-30 MED ORDER — ACETAMINOPHEN 650 MG RE SUPP: 650.0000 mg | Freq: Four times a day (QID) | RECTAL | Status: DC | PRN

## 2014-03-30 MED ORDER — LIDOCAINE HCL (CARDIAC) 20 MG/ML IV SOLN
INTRAVENOUS | Status: DC | PRN
  Administered 2014-03-30: 70 mg via INTRAVENOUS

## 2014-03-30 SURGICAL SUPPLY — 72 items
BIT DRILL 170X2.5X (BIT) IMPLANT
BIT DRL 170X2.5X (BIT)
BLADE SAW SAG 73X25 THK (BLADE) ×2
BLADE SAW SGTL 73X25 THK (BLADE) ×1 IMPLANT
CAPT SHOULD DELTAXTEND CEM MOD ×3 IMPLANT
CEMENT BONE DEPUY (Cement) ×3 IMPLANT
CLOSURE STERI-STRIP 1/2X4 (GAUZE/BANDAGES/DRESSINGS) ×1
CLOSURE WOUND 1/2 X4 (GAUZE/BANDAGES/DRESSINGS) ×1
CLSR STERI-STRIP ANTIMIC 1/2X4 (GAUZE/BANDAGES/DRESSINGS) ×2 IMPLANT
COVER SURGICAL LIGHT HANDLE (MISCELLANEOUS) ×3 IMPLANT
DRAPE IMP U-DRAPE 54X76 (DRAPES) ×3 IMPLANT
DRAPE INCISE IOBAN 66X45 STRL (DRAPES) ×9 IMPLANT
DRAPE U-SHAPE 47X51 STRL (DRAPES) ×3 IMPLANT
DRILL 2.5 (BIT)
DRILL BIT 5/64 (BIT) ×3 IMPLANT
DRSG ADAPTIC 3X8 NADH LF (GAUZE/BANDAGES/DRESSINGS) ×3 IMPLANT
DRSG PAD ABDOMINAL 8X10 ST (GAUZE/BANDAGES/DRESSINGS) ×3 IMPLANT
DURAPREP 26ML APPLICATOR (WOUND CARE) ×3 IMPLANT
ELECT BLADE 4.0 EZ CLEAN MEGAD (MISCELLANEOUS) ×3
ELECT NEEDLE TIP 2.8 STRL (NEEDLE) ×3 IMPLANT
ELECT REM PT RETURN 9FT ADLT (ELECTROSURGICAL) ×3
ELECTRODE BLDE 4.0 EZ CLN MEGD (MISCELLANEOUS) ×1 IMPLANT
ELECTRODE REM PT RTRN 9FT ADLT (ELECTROSURGICAL) ×1 IMPLANT
GAUZE SPONGE 4X4 12PLY STRL (GAUZE/BANDAGES/DRESSINGS) ×3 IMPLANT
GLOVE BIO SURGEON STRL SZ7 (GLOVE) ×3 IMPLANT
GLOVE BIOGEL PI IND STRL 7.0 (GLOVE) ×1 IMPLANT
GLOVE BIOGEL PI INDICATOR 7.0 (GLOVE) ×2
GLOVE BIOGEL PI ORTHO PRO 7.5 (GLOVE) ×2
GLOVE BIOGEL PI ORTHO PRO SZ8 (GLOVE) ×2
GLOVE ORTHO TXT STRL SZ7.5 (GLOVE) ×3 IMPLANT
GLOVE PI ORTHO PRO STRL 7.5 (GLOVE) ×1 IMPLANT
GLOVE PI ORTHO PRO STRL SZ8 (GLOVE) ×1 IMPLANT
GLOVE SURG ORTHO 8.5 STRL (GLOVE) ×6 IMPLANT
GOWN STRL REUS W/ TWL XL LVL3 (GOWN DISPOSABLE) ×3 IMPLANT
GOWN STRL REUS W/TWL XL LVL3 (GOWN DISPOSABLE) ×9
KIT BASIN OR (CUSTOM PROCEDURE TRAY) ×3 IMPLANT
KIT ROOM TURNOVER OR (KITS) ×3 IMPLANT
MANIFOLD NEPTUNE II (INSTRUMENTS) ×3 IMPLANT
NEEDLE 1/2 CIR MAYO (NEEDLE) ×3 IMPLANT
NEEDLE HYPO 25GX1X1/2 BEV (NEEDLE) ×3 IMPLANT
NS IRRIG 1000ML POUR BTL (IV SOLUTION) ×3 IMPLANT
PACK SHOULDER (CUSTOM PROCEDURE TRAY) ×3 IMPLANT
PACK UNIVERSAL I (CUSTOM PROCEDURE TRAY) ×3 IMPLANT
PAD ARMBOARD 7.5X6 YLW CONV (MISCELLANEOUS) ×6 IMPLANT
PIN GUIDE 1.2 (PIN) IMPLANT
PIN GUIDE GLENOPHERE 1.5MX300M (PIN) IMPLANT
PIN METAGLENE 2.5 (PIN) IMPLANT
SCREW 4.5X36MM (Screw) IMPLANT
SLING ARM IMMOBILIZER LRG (SOFTGOODS) ×3 IMPLANT
SMARTMIX MINI TOWER (MISCELLANEOUS) ×3
SPONGE GAUZE 4X4 12PLY STER LF (GAUZE/BANDAGES/DRESSINGS) ×3 IMPLANT
SPONGE LAP 18X18 X RAY DECT (DISPOSABLE) ×3 IMPLANT
SPONGE LAP 4X18 X RAY DECT (DISPOSABLE) ×3 IMPLANT
SPONGE SURGIFOAM ABS GEL SZ50 (HEMOSTASIS) IMPLANT
STRIP CLOSURE SKIN 1/2X4 (GAUZE/BANDAGES/DRESSINGS) ×2 IMPLANT
SUCTION FRAZIER TIP 10 FR DISP (SUCTIONS) ×3 IMPLANT
SUT FIBERWIRE #2 38 T-5 BLUE (SUTURE) ×6
SUT MNCRL AB 4-0 PS2 18 (SUTURE) ×3 IMPLANT
SUT VIC AB 0 CT1 27 (SUTURE) ×2
SUT VIC AB 0 CT1 27XBRD ANBCTR (SUTURE) ×1 IMPLANT
SUT VIC AB 2-0 CT1 27 (SUTURE) ×3
SUT VIC AB 2-0 CT1 TAPERPNT 27 (SUTURE) ×1 IMPLANT
SUT VICRYL AB 2 0 TIES (SUTURE) ×3 IMPLANT
SUTURE FIBERWR #2 38 T-5 BLUE (SUTURE) ×2 IMPLANT
SYR CONTROL 10ML LL (SYRINGE) ×3 IMPLANT
TAPE CLOTH SURG 6X10 WHT LF (GAUZE/BANDAGES/DRESSINGS) ×3 IMPLANT
TOWEL OR 17X24 6PK STRL BLUE (TOWEL DISPOSABLE) ×3 IMPLANT
TOWEL OR 17X26 10 PK STRL BLUE (TOWEL DISPOSABLE) ×3 IMPLANT
TOWER SMARTMIX MINI (MISCELLANEOUS) ×1 IMPLANT
TRAY FOLEY CATH 16FRSI W/METER (SET/KITS/TRAYS/PACK) IMPLANT
WATER STERILE IRR 1000ML POUR (IV SOLUTION) ×3 IMPLANT
YANKAUER SUCT BULB TIP NO VENT (SUCTIONS) IMPLANT

## 2014-03-30 NOTE — Interval H&P Note (Signed)
History and Physical Interval Note:  03/30/2014 7:33 AM  Shelia Barber  has presented today for surgery, with the diagnosis of left shoulder osteoarthritis  The various methods of treatment have been discussed with the patient and family. After consideration of risks, benefits and other options for treatment, the patient has consented to  Procedure(s): LEFT TOTAL SHOULDER ARTHROPLASTY VS REVERSE ARTHROPLASTY (Left) as a surgical intervention .  The patient's history has been reviewed, patient examined, no change in status, stable for surgery.  I have reviewed the patient's chart and labs.  Questions were answered to the patient's satisfaction.     Dejana Pugsley,STEVEN R

## 2014-03-30 NOTE — Anesthesia Preprocedure Evaluation (Addendum)
Anesthesia Evaluation  Patient identified by MRN, date of birth, ID band Patient awake    Reviewed: Allergy & Precautions, H&P , NPO status , Patient's Chart, lab work & pertinent test results  Airway Mallampati: II  TM Distance: >3 FB Neck ROM: full    Dental  (+) Dental Advisory Given, Teeth Intact   Pulmonary sleep apnea ,          Cardiovascular hypertension, Pt. on medications     Neuro/Psych  Headaches, TIA Neuromuscular disease    GI/Hepatic hiatal hernia, GERD-  ,  Endo/Other  diabetes, Type obesity  Renal/GU      Musculoskeletal  (+) Arthritis -, Rheumatoid disorders,    Abdominal   Peds  Hematology   Anesthesia Other Findings   Reproductive/Obstetrics                            Anesthesia Physical Anesthesia Plan  ASA: III  Anesthesia Plan: General and Regional   Post-op Pain Management: MAC Combined w/ Regional for Post-op pain   Induction: Intravenous  Airway Management Planned: Oral ETT  Additional Equipment:   Intra-op Plan:   Post-operative Plan: Extubation in OR  Informed Consent: I have reviewed the patients History and Physical, chart, labs and discussed the procedure including the risks, benefits and alternatives for the proposed anesthesia with the patient or authorized representative who has indicated his/her understanding and acceptance.   Dental advisory given  Plan Discussed with: CRNA, Anesthesiologist and Surgeon  Anesthesia Plan Comments:        Anesthesia Quick Evaluation

## 2014-03-30 NOTE — Progress Notes (Signed)
Utilization review completed.  

## 2014-03-30 NOTE — Brief Op Note (Signed)
03/30/2014  10:07 AM  PATIENT:  Shelia Barber  73 y.o. female  PRE-OPERATIVE DIAGNOSIS:  left shoulder osteoarthritis, end stage  POST-OPERATIVE DIAGNOSIS:  left shoulder osteoarthritis, end stage, rotator cuff insufficiency  PROCEDURE:  Procedure(s): LEFT TOTAL REVERSE SHOULDER ARTHROPLASTY (Left) DePuy Delta Xtend Reverse  SURGEON:  Surgeon(s) and Role:    * Verlee Rossetti, MD - Primary  PHYSICIAN ASSISTANT:   ASSISTANTS: Thea Gist, PA-C   ANESTHESIA:   regional and general  EBL:  Total I/O In: 750 [I.V.:750] Out: -   BLOOD ADMINISTERED:none  DRAINS: none   LOCAL MEDICATIONS USED:  MARCAINE     SPECIMEN:  No Specimen  DISPOSITION OF SPECIMEN:  N/A  COUNTS:  YES  TOURNIQUET:  * No tourniquets in log *  DICTATION: .Other Dictation: Dictation Number [id number]  PLAN OF CARE: Admit to inpatient   PATIENT DISPOSITION:  PACU - hemodynamically stable.   Delay start of Pharmacological VTE agent (>24hrs) due to surgical blood loss or risk of bleeding: not applicable

## 2014-03-30 NOTE — Transfer of Care (Signed)
Immediate Anesthesia Transfer of Care Note  Patient: Shelia Barber  Procedure(s) Performed: Procedure(s): LEFT TOTAL REVERSE SHOULDER ARTHROPLASTY (Left)  Patient Location: PACU  Anesthesia Type:General  Level of Consciousness: awake, alert  and oriented  Airway & Oxygen Therapy: Patient Spontanous Breathing and Patient connected to nasal cannula oxygen  Post-op Assessment: Report given to PACU RN, Post -op Vital signs reviewed and stable and Patient moving all extremities  Post vital signs: Reviewed and stable  Complications: No apparent anesthesia complications

## 2014-03-30 NOTE — Anesthesia Postprocedure Evaluation (Signed)
Anesthesia Post Note  Patient: Shelia Barber  Procedure(s) Performed: Procedure(s) (LRB): LEFT TOTAL REVERSE SHOULDER ARTHROPLASTY (Left)  Anesthesia type: General  Patient location: PACU  Post pain: Pain level controlled and Adequate analgesia  Post assessment: Post-op Vital signs reviewed, Patient's Cardiovascular Status Stable, Respiratory Function Stable, Patent Airway and Pain level controlled  Last Vitals:  Filed Vitals:   03/30/14 1115  BP: 117/68  Pulse: 70  Temp:   Resp: 22    Post vital signs: Reviewed and stable  Level of consciousness: awake, alert  and oriented  Complications: No apparent anesthesia complications

## 2014-03-30 NOTE — Anesthesia Procedure Notes (Addendum)
Procedure Name: Intubation Date/Time: 03/30/2014 8:00 AM Performed by: Charm Barges, DAVID R Pre-anesthesia Checklist: Patient identified, Emergency Drugs available, Suction available, Patient being monitored and Timeout performed Patient Re-evaluated:Patient Re-evaluated prior to inductionOxygen Delivery Method: Circle system utilized Preoxygenation: Pre-oxygenation with 100% oxygen Intubation Type: IV induction Ventilation: Mask ventilation without difficulty Laryngoscope Size: Mac and 3 Grade View: Grade II Tube type: Oral Tube size: 7.5 mm Number of attempts: 1 Airway Equipment and Method: Stylet Placement Confirmation: ETT inserted through vocal cords under direct vision,  positive ETCO2 and breath sounds checked- equal and bilateral Secured at: 21 cm Tube secured with: Tape Dental Injury: Teeth and Oropharynx as per pre-operative assessment    Anesthesia Regional Block:  Interscalene brachial plexus block  Pre-Anesthetic Checklist: ,, timeout performed, Correct Patient, Correct Site, Correct Laterality, Correct Procedure, Correct Position, site marked, Risks and benefits discussed,  Surgical consent,  Pre-op evaluation,  At surgeon's request and post-op pain management  Laterality: Left  Prep: chloraprep       Needles:  Injection technique: Single-shot  Needle Type: Echogenic Stimulator Needle     Needle Length: 5cm 5 cm Needle Gauge: 22 and 22 G    Additional Needles:  Procedures: ultrasound guided (picture in chart) and nerve stimulator Interscalene brachial plexus block  Nerve Stimulator or Paresthesia:  Response: biceps flexion, 0.45 mA,   Additional Responses:   Narrative:  Start time: 03/30/2014 7:02 AM End time: 03/30/2014 7:12 AM Injection made incrementally with aspirations every 5 mL.  Performed by: Personally   Additional Notes: Functioning IV was confirmed and monitors were applied.  A 69mm 22ga Arrow echogenic stimulator needle was used. Sterile prep  and drape,hand hygiene and sterile gloves were used.  Negative aspiration and negative test dose prior to incremental administration of local anesthetic. The patient tolerated the procedure well.  Ultrasound guidance: relevent anatomy identified, needle position confirmed, local anesthetic spread visualized around nerve(s), vascular puncture avoided.  Image printed for medical record.

## 2014-03-31 LAB — BASIC METABOLIC PANEL
Anion gap: 14 (ref 5–15)
BUN: 11 mg/dL (ref 6–23)
CALCIUM: 8.4 mg/dL (ref 8.4–10.5)
CHLORIDE: 100 meq/L (ref 96–112)
CO2: 24 mEq/L (ref 19–32)
CREATININE: 0.68 mg/dL (ref 0.50–1.10)
GFR calc non Af Amer: 85 mL/min — ABNORMAL LOW (ref >90)
Glucose, Bld: 146 mg/dL — ABNORMAL HIGH (ref 70–99)
Potassium: 4.1 mEq/L (ref 3.7–5.3)
Sodium: 138 mEq/L (ref 137–147)

## 2014-03-31 LAB — HEMOGLOBIN AND HEMATOCRIT, BLOOD
HCT: 37.6 % (ref 36.0–46.0)
Hemoglobin: 12.1 g/dL (ref 12.0–15.0)

## 2014-03-31 LAB — GLUCOSE, CAPILLARY
GLUCOSE-CAPILLARY: 190 mg/dL — AB (ref 70–99)
GLUCOSE-CAPILLARY: 143 mg/dL — AB (ref 70–99)
Glucose-Capillary: 151 mg/dL — ABNORMAL HIGH (ref 70–99)
Glucose-Capillary: 169 mg/dL — ABNORMAL HIGH (ref 70–99)

## 2014-03-31 NOTE — Progress Notes (Signed)
Shelia Barber  MRN: [id number] DOB/Age: 1941-05-02 73 y.o. Physician: Jacquelyne Balint Procedure: Procedure(s) (LRB): LEFT TOTAL REVERSE SHOULDER ARTHROPLASTY (Left)     Subjective: Up in chair. Family member in room, plan is to DC to skilled as she lives alone. voiding well  Vital Signs Temp:  [97.5 F (36.4 C)-100 F (37.8 C)] 98.9 F (37.2 C) (11/07 0600) Pulse Rate:  [66-87] 84 (11/07 0520) Resp:  [17-23] 19 (11/07 0520) BP: (101-199)/(63-75) 123/75 mmHg (11/07 0520) SpO2:  [92 %-100 %] 99 % (11/07 0520)  Lab Results  Recent Labs  03/31/14 0455  HGB 12.1  HCT 37.6   BMET  Recent Labs  03/31/14 0455  NA 138  K 4.1  CL 100  CO2 24  GLUCOSE 146*  BUN 11  CREATININE 0.68  CALCIUM 8.4   INR  Date Value Ref Range Status  04/28/2012 0.90 0.00 - 1.49 Final     Exam Left upper extremity NVI Dressing dry        Plan Dc IV Cont oob Skilled planning for dc  Jamie Hafford for Dr.Kevin Supple 03/31/2014, 9:46 AM

## 2014-03-31 NOTE — Op Note (Signed)
NAMECHASTA, DESHPANDE NO.:  [id number]  MEDICAL RECORD NO.:  [id number]  LOCATION:  [id number]                        FACILITY:  [facility]  PHYSICIAN:  Almedia Balls. Ranell Patrick, M.D. DATE OF BIRTH:  04-05-1941  DATE OF PROCEDURE:  03/30/2014 DATE OF DISCHARGE:                              OPERATIVE REPORT   PREOPERATIVE DIAGNOSIS:  Left shoulder end-stage arthritis and rotator cuff insufficiency.  POSTOPERATIVE DIAGNOSIS:  Left shoulder end-stage arthritis and rotator cuff insufficiency.  PROCEDURE PERFORMED:  Left shoulder reverse total shoulder arthroplasty using DePuy Delta Xtend prosthesis.  ATTENDING SURGEON:  Almedia Balls. Ranell Patrick, MD  ASSISTANT:  Donnie Coffin. Dixon, PA-C, who scrubbed the entire procedure and necessary for satisfactory completion of surgery.  ANESTHESIA:  General anesthesia was used plus interscalene block.  ESTIMATED BLOOD LOSS:  Minimal.  FLUID REPLACEMENT:  1000 mL crystalloid.  INSTRUMENT COUNTS:  Correct.  COMPLICATIONS:  There were no complications.  ANTIBIOTICS:  Perioperative antibiotics were given.  INDICATIONS:  Patient is a 73 year old female with a terrible function of left shoulder and severe pain secondary to end-stage arthritis and likely rotator cuff insufficiency.  The patient has failed all measures of conservative management, desires operative treatment to restore function, eliminate pain, informed consent obtained.  DESCRIPTION OF PROCEDURE:  After an adequate level of anesthesia achieved, the patient was positioned in the modified beach-chair position.  Left shoulder correctly identified.  Exam under anesthesia, patient had a very poor passive range of motion with about a 20 to 30 degree internal external rotation, arch, and foot elevation may be 60 degrees passively and that is with her sleep.  After sterile prep and drape, time-out called.  We entered the shoulder using a deltopectoral approach starting coracoid process  extending down to the anterior humerus.  Dissection down through subcutaneous tissues, identified cephalic vein, took it laterally, the deltoid pectoralis taken medially. Conjoined tendon retracted medially, identified the subscapularis and released it off the lesser tuberosity, it was extremely stiff and did not balance at all and was quite tight and then we went ahead and placed #2 Hi-Fi sutures to assist with retraction.  At this point, we went ahead and released the capsule off the inferior neck of the humerus progressively externally rotating large osteophytes noted, bone-on-bone was noted.  We then released the biceps and the very stiff and diminutive anterior rotator cuff tendon as we internally rotated the shoulder and then placed our Brown retractor delivering the humeral head out of the wound.  We entered the proximal humerus using a 6 mm reamer. We then went to the 8 mm reamer and we attempted to get to the 10 mm reamer, we could only get it about halfway down and just did not want to push that, so I was using quite a bit of force on the power with a 10, so it was decided, we are going to go with an 8 pore coat stem and then we need to size 1 epi, 1 left metaphyseal HA coated proximal portion. We went ahead and milled for the proximal metaphyseal section of the humeral component going off the 8 mm intramedullary guide.  Once we had done that,  we inserted our trial component.  We did note there to be a split in the greater tuberosity, at that time, it felt like it was stable though.  We then went ahead and retracted the humerus posteriorly, did a 360 degree capsule removal, protecting the axillary nerve, released the subscap tendon, again poor balance, very stiff, and very thin.  We then released the biceps tendon off the superior labrum area.  We debrided the entire labrum, there was a huge osteophyte posteriorly off the posterior face of the glenoid, we removed that  using rongeur and Cobb elevator.  Once we had a full exposure of the glenoid face, we placed our central guide pin.  We then reamed for the metaglene and then drilled the center PEG hole impacted the metaglene in position and placed a 36 lock screw inferiorly 30 locked in the base of the coracoid and 18 nonlocked posteriorly excellent purchase and security of the metaglene at the inferior aspect of the glenoid face.  We then went ahead and placed a glenosphere of 38 standard glenosphere and tightened that in position, careful to protect the axillary nerve.  We then reduced with a 38+3 poly.  We were happy with soft tissue balancing.  We removed the trial components, as we were removing the trial component, we noted that the greater tuberosity section of bone had become unstable and we went ahead and removed it, did feel like we would have adequate purchase with the remaining 207 degrees of bone coverage proximally and also thought we would augment with some cement distally and selected DePuy 1 cement, mixed that, irrigated thoroughly and dried the canal. We then placed vacuum mixed cement by hand down into the canal to secure the distal part of the stem.  We placed our size A pore coat, DePuy Delta Xtend stem with the size 1 epi left metaphyseal component HA and I went ahead and impacted that to the appropriate version which was 10 degrees of retroversion.  We had good security with that insertion of the stem, and felt like it was secured proximally with some bony conformity with 207 degrees of bony support and then also distally with the cement.  At this point, we went ahead and selected a 38+6, we felt like we get that +6 in and that would give [place] a little bit of extra tension, given that she really had no rotator cuff remaining nor subscap.  We then reduced the shoulder to allow the cement to set up. We checked our version again which was spot on, checked our axillary nerve which was  out of the way and not under undue tension.  Again inspected the subscap that it really could not get all the way back out to where the tuberosity was and since the greater tuberosity was off, we went ahead and released the subscap tendon, thoroughly irrigated the entire wound, tenodesed the biceps to the pack and then did our deltopectoral repair with 0 Vicryl suture followed by 2-0 after thorough irrigation, did our deltopectoral pair and then 2-0 Vicryl subcutaneous closure and 4-0 Monocryl for skin.  Steri-Strips applied followed by sterile dressing.  The patient tolerated surgery well.     Almedia Balls. Ranell Patrick, M.D.     SRN/MEDQ  D:  03/30/2014  T:  03/31/2014  Job:  [id number]

## 2014-03-31 NOTE — Evaluation (Signed)
Occupational Therapy Evaluation Patient Details Name: Shelia Barber MRN: [id number] DOB: 07-09-1940 Today's Date: 03/31/2014    History of Present Illness 73 y.o. left reverse TSA.   Clinical Impression   Pt s/p above. Pt independent with ADLs, PTA. Feel pt will benefit from acute OT to increase independence prior to d/c and work on mobilizing LUE. Pt lives alone, so recommend SNF for rehab prior to d/c home.     Follow Up Recommendations  SNF;Supervision/Assistance - 24 hour    Equipment Recommendations  Other (comment) (defer to next venue)    Recommendations for Other Services       Precautions / Restrictions Precautions Precautions: Fall;Shoulder Type of Shoulder Precautions: ADL education; sling education, pendulums; elbow, wrist hand AROM; Active/Assist shoulder ROM within limits of pain; Rehabilitation per Dr Ranell Patrick' protocol. Shoulder Interventions: Shoulder sling/immobilizer;For comfort Precaution Booklet Issued: Yes (comment) Precaution Comments: educated on precautions Required Braces or Orthoses: Sling Restrictions Weight Bearing Restrictions: Yes LUE Weight Bearing: Non weight bearing      Mobility Bed Mobility Overal bed mobility: Needs Assistance Bed Mobility: Supine to Sit     Supine to sit: Mod assist     General bed mobility comments: assist with trunk to sit EOB  Transfers Overall transfer level: Needs assistance   Transfers: Sit to/from Stand Sit to Stand: Min guard              Balance                                            ADL Overall ADL's : Needs assistance/impaired                         Toilet Transfer: Ambulation;Minimal assistance (OT assisted in pushing IV pole)   Toileting- Clothing Manipulation and Hygiene: Moderate assistance;Sit to/from stand       Functional mobility during ADLs: Minimal assistance General ADL Comments: Pt ambulated to bathroom. Discussed what pt could use as  toilet aide for hygiene as she states she has difficulty reaching behind with RUE. Educated on ADL information (see shoulder section below).     Vision                     Perception     Praxis      Pertinent Vitals/Pain Pain Assessment: 0-10 Pain Score: 9  Pain Location: left shoulder Pain Intervention(s): Repositioned;Monitored during session     Hand Dominance Right   Extremity/Trunk Assessment Upper Extremity Assessment Upper Extremity Assessment: LUE deficits/detail LUE Deficits / Details: left reverse TSA   Lower Extremity Assessment Lower Extremity Assessment: Overall WFL for tasks assessed       Communication Communication Communication: No difficulties   Cognition Arousal/Alertness: Awake/alert Behavior During Therapy: WFL for tasks assessed/performed Overall Cognitive Status: Within Functional Limits for tasks assessed       Memory: Decreased short-term memory (pt states she has decreased memory)             General Comments       Exercises Exercises: Shoulder;Other exercises Other Exercises Other Exercises: Performed approximately 10 reps of A/AAROM left elbow flexion/extension, approximately 10 reps of AROM wrist flexion/extension, and approximately 10 reps of digit composite flexion/extension. Told pt she can also be moving neck around; explained importance of elbow, wrist, hand exercises Other Exercises: Pendulums with left  shoulder; approximately 10 reps of each   Shoulder Instructions Shoulder Instructions Donning/doffing shirt without moving shoulder:  (educated on technique/shirts;pt able to verbalize) Method for sponge bathing under operated UE:  (educated ) Donning/doffing sling/immobilizer:  (educated/demonstrated) Correct positioning of sling/immobilizer:  (educated) Pendulum exercises (written home exercise program): Moderate assistance (OT assisted pt at hips) ROM for elbow, wrist and digits of operated UE: Minimal  assistance Sling wearing schedule (on at all times/off for ADL's):  (educated;pt able to verbalize for comfort) Positioning of UE while sleeping:  (educated; pt able to verbalize)    Home Living Family/patient expects to be discharged to:: Skilled nursing facility                                 Additional Comments: pt lives alone; planning to go to SNF and recommend her doing so.      Prior Functioning/Environment Level of Independence: Independent             OT Diagnosis: Acute pain   OT Problem List: Decreased strength;Impaired balance (sitting and/or standing);Decreased knowledge of use of DME or AE;Decreased knowledge of precautions;Pain;Impaired UE functional use;Decreased activity tolerance   OT Treatment/Interventions: Self-care/ADL training;DME and/or AE instruction;Therapeutic activities;Balance training;Patient/family education;Therapeutic exercise    OT Goals(Current goals can be found in the care plan section) Acute Rehab OT Goals Patient Stated Goal: not stated OT Goal Formulation: With patient Time For Goal Achievement: 04/07/14 Potential to Achieve Goals: Good  OT Frequency: Min 2X/week   Barriers to D/C:            Co-evaluation            End of Session Equipment Utilized During Treatment: Gait belt;Other (comment) (sling)  Activity Tolerance: Patient tolerated treatment well Patient left: in chair;with call bell/phone within reach   Time: 8756-4332 OT Time Calculation (min): 26 min  Charges:  OT General Charges $OT Visit: 1 Procedure OT Evaluation $Initial OT Evaluation Tier I: 1 Procedure OT Treatments $Self Care/Home Management : 8-22 mins G-CodesEarlie Raveling OTR/L [phone] 03/31/2014, 8:58 AM

## 2014-04-01 LAB — GLUCOSE, CAPILLARY
GLUCOSE-CAPILLARY: 154 mg/dL — AB (ref 70–99)
GLUCOSE-CAPILLARY: 142 mg/dL — AB (ref 70–99)
Glucose-Capillary: 171 mg/dL — ABNORMAL HIGH (ref 70–99)

## 2014-04-01 NOTE — Progress Notes (Signed)
Subjective: 2 Days Post-Op Procedure(s) (LRB): LEFT TOTAL REVERSE SHOULDER ARTHROPLASTY (Left) Patient reports pain as moderate.  Reports pain L shoulder, into pectoralis region, upper arm. Denies numbness or tingling. Pain controlled with Norco. States having a tough time with PT. She lives alone and the plan is to D/C to SNF, awaiting placement. No other c/o.  Objective: Vital signs in last 24 hours: Temp:  [98.1 F (36.7 C)-99.8 F (37.7 C)] 99.8 F (37.7 C) (11/08 5643) Pulse Rate:  [88-89] 89 (11/08 0614) Resp:  [15-18] 15 (11/08 0614) BP: (100-157)/(46-78) 132/64 mmHg (11/08 0614) SpO2:  [91 %-95 %] 91 % (11/08 0614)  Intake/Output from previous day: 11/07 0701 - 11/08 0700 In: 850 [P.O.:850] Out: -  Intake/Output this shift:     Recent Labs  03/31/14 0455  HGB 12.1    Recent Labs  03/31/14 0455  HCT 37.6    Recent Labs  03/31/14 0455  NA 138  K 4.1  CL 100  CO2 24  BUN 11  CREATININE 0.68  GLUCOSE 146*  CALCIUM 8.4   No results for input(s): LABPT, INR in the last 72 hours.  Neurologically intact ABD soft Neurovascular intact Sensation intact distally Intact pulses distally Dorsiflexion/Plantar flexion intact Incision: dressing C/D/I and no drainage No cellulitis present Compartment soft no calf pain or sign of DVT  Assessment/Plan: 2 Days Post-Op Procedure(s) (LRB): LEFT TOTAL REVERSE SHOULDER ARTHROPLASTY (Left) Advance diet Up with therapy D/C IV fluids  Dressing changed this AM Continue PT Awaiting SNF for D/C, likely tomorrow   Andrez Grime M. 04/01/2014, 8:33 AM

## 2014-04-01 NOTE — Progress Notes (Addendum)
Occupational Therapy Treatment Patient Details Name: Shelia Barber MRN: [id number] DOB: [birth year] Today's Date: 04/01/2014    History of present illness 73 y.o. left reverse TSA.   OT comments  Performed UE exercises and reviewed some ADL information.   Follow Up Recommendations  SNF;Supervision/Assistance - 24 hour    Equipment Recommendations  Other (comment) (defer to next venue)    Recommendations for Other Services      Precautions / Restrictions Precautions Precautions: Fall;Shoulder Type of Shoulder Precautions: ADL education; sling education, pendulums; elbow, wrist hand AROM; Active/Assist shoulder ROM within limits of pain; Rehabilitation per Dr Ranell Patrick' protocol. Shoulder Interventions: Shoulder sling/immobilizer;For comfort Precaution Booklet Issued: Yes (comment) Precaution Comments: educated on precautions Required Braces or Orthoses: Sling Restrictions Weight Bearing Restrictions: Yes LUE Weight Bearing: Non weight bearing       Mobility Bed Mobility               General bed mobility comments: not assessed  Transfers Overall transfer level: Needs assistance   Transfers: Sit to/from Stand Sit to Stand: Supervision              Balance                                   ADL Overall ADL's : Needs assistance/impaired Eating/Feeding: Set up;Sitting                       Toilet Transfer: Min guard;Ambulation (chair)           Functional mobility during ADLs: Min guard General ADL Comments: Pt practiced doffing sling and then performed exercises. OT assisted with opening food packages.  Educated on precautions and explained she can use LUE for functional activities. told pt not to perform standing exercises alone.      Vision                     Perception     Praxis      Cognition  Awake/Alert Behavior During Therapy: WFL for tasks assessed/performed Overall Cognitive Status: Within Functional  Limits for tasks assessed                       Extremity/Trunk Assessment               Exercises Other Exercises Other Exercises: Performed approximately 10 reps of A/AAROM left elbow flexion/extension, approximately 10 reps of AROM wrist flexion/extension, and approximately 10 reps of digit composite flexion/extension. Few reps of neck flexion/extension and lateral flexion Other Exercises: approximately 10 reps of left AROM supination/pronation Donning/doffing shirt without moving shoulder:  (pt able to verbalize) Donning/doffing sling/immobilizer: Minimal assistance (practiced doffing and OT demonstrated donning) Correct positioning of sling/immobilizer:  (educated-pt able to verbalize) Pendulum exercises (written home exercise program): Moderate assistance (assisted at hips) ROM for elbow, wrist and digits of operated UE: Min-guard Sling wearing schedule (on at all times/off for ADL's):  (educated for comfort) Positioning of UE while sleeping:  (pt able to verbalize)   Shoulder Instructions Shoulder Instructions Donning/doffing shirt without moving shoulder:  (pt able to verbalize) Donning/doffing sling/immobilizer: Minimal assistance (practiced doffing and OT demonstrated donning) Correct positioning of sling/immobilizer:  (educated-pt able to verbalize) Pendulum exercises (written home exercise program): Moderate assistance (assisted at hips) ROM for elbow, wrist and digits of operated UE: Min-guard Sling wearing schedule (on at all times/off for ADL's):  (  educated for comfort) Positioning of UE while sleeping:  (pt able to verbalize)     General Comments      Pertinent Vitals/ Pain       Pain Assessment: 0-10 Pain Score: 9  Pain Location: LUE Pain Intervention(s): Monitored during session;Repositioned  Home Living                                          Prior Functioning/Environment              Frequency Min 2X/week      Progress Toward Goals  OT Goals(current goals can now be found in the care plan section)  Progress towards OT goals: Progressing toward goals  Acute Rehab OT Goals Patient Stated Goal: not stated OT Goal Formulation: With patient Time For Goal Achievement: 04/07/14 Potential to Achieve Goals: Good ADL Goals Pt Will Transfer to Toilet: with supervision;ambulating Pt Will Perform Toileting - Clothing Manipulation and hygiene: with modified independence;with adaptive equipment;sit to/from stand Additional ADL Goal #1: Pt will perform exercises at supervision level. Additional ADL Goal #2: Pt will perform UB ADLs at supervision level.   Plan Discharge plan remains appropriate    Co-evaluation                 End of Session Equipment Utilized During Treatment: Gait belt   Activity Tolerance Patient tolerated treatment well   Patient Left in chair;with call bell/phone within reach   Nurse Communication          Time: 1213-1227 OT Time Calculation (min): 14 min  Charges: OT General Charges $OT Visit: 1 Procedure OT Treatments $Therapeutic Exercise: 8-22 mins  Earlie Raveling OTR/L [phone] 04/01/2014, 1:40 PM

## 2014-04-02 LAB — GLUCOSE, CAPILLARY
Glucose-Capillary: 117 mg/dL — ABNORMAL HIGH (ref 70–99)
Glucose-Capillary: 139 mg/dL — ABNORMAL HIGH (ref 70–99)
Glucose-Capillary: 177 mg/dL — ABNORMAL HIGH (ref 70–99)
Glucose-Capillary: 163 mg/dL — ABNORMAL HIGH (ref 70–99)

## 2014-04-02 NOTE — Discharge Summary (Signed)
Physician Discharge Summary   Patient ID: Shelia Barber MRN: [id number] DOB/AGE: Aug 30, 1940 73 y.o.  Admit date: 03/30/2014 Discharge date: 04/02/2014  Admission Diagnoses:  Active Problems:   S/p reverse total shoulder arthroplasty   Discharge Diagnoses:  Same   Surgeries: Procedure(s): LEFT TOTAL REVERSE SHOULDER ARTHROPLASTY on 03/30/2014   Consultants: OT, D/C planning  Discharged Condition: Stable  Hospital Course: Shelia Barber is an 73 y.o. 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  They were brought to the operating room on 03/30/2014 and underwent the above named procedures.    The patient had an uncomplicated hospital course and was stable for discharge.  Recent vital signs:  Filed Vitals:   04/02/14 0617  BP: 108/88  Pulse: 86  Temp: 99.1 F (37.3 C)  Resp: 16    Recent laboratory studies:  Results for orders placed or performed during the hospital encounter of 03/30/14  Glucose, capillary  Result Value Ref Range   Glucose-Capillary 103 (H) 70 - 99 mg/dL  Glucose, capillary  Result Value Ref Range   Glucose-Capillary 132 (H) 70 - 99 mg/dL   Comment 1 Documented in Chart    Comment 2 Notify RN   Glucose, capillary  Result Value Ref Range   Glucose-Capillary 143 (H) 70 - 99 mg/dL  Glucose, capillary  Result Value Ref Range   Glucose-Capillary 157 (H) 70 - 99 mg/dL   Comment 1 Notify RN   Hemoglobin and hematocrit, blood  Result Value Ref Range   Hemoglobin 12.1 12.0 - 15.0 g/dL   HCT 02.7 74.1 - 28.7 %  Basic metabolic panel  Result Value Ref Range   Sodium 138 137 - 147 mEq/L   Potassium 4.1 3.7 - 5.3 mEq/L   Chloride 100 96 - 112 mEq/L   CO2 24 19 - 32 mEq/L   Glucose, Bld 146 (H) 70 - 99 mg/dL   BUN 11 6 - 23 mg/dL   Creatinine, Ser 8.67 0.50 - 1.10 mg/dL   Calcium 8.4 8.4 - 67.2 mg/dL   GFR calc non Af Amer 85 (L) >90 mL/min   GFR calc Af Amer >90 >90 mL/min   Anion gap 14 5 - 15  Glucose, capillary  Result Value Ref Range   Glucose-Capillary 151 (H) 70 - 99 mg/dL  Glucose, capillary  Result Value Ref Range   Glucose-Capillary 169 (H) 70 - 99 mg/dL  Glucose, capillary  Result Value Ref Range   Glucose-Capillary 190 (H) 70 - 99 mg/dL   Comment 1 Documented in Chart    Comment 2 Notify RN   Glucose, capillary  Result Value Ref Range   Glucose-Capillary 143 (H) 70 - 99 mg/dL  Glucose, capillary  Result Value Ref Range   Glucose-Capillary 154 (H) 70 - 99 mg/dL  Glucose, capillary  Result Value Ref Range   Glucose-Capillary 171 (H) 70 - 99 mg/dL  Glucose, capillary  Result Value Ref Range   Glucose-Capillary 142 (H) 70 - 99 mg/dL  Glucose, capillary  Result Value Ref Range   Glucose-Capillary 117 (H) 70 - 99 mg/dL    Discharge Medications:     Medication List    TAKE these medications        atorvastatin 20 MG tablet  Commonly known as:  LIPITOR  Take 20 mg by mouth daily.     cetirizine 10 MG tablet  Commonly known as:  ZYRTEC  Take 10 mg by mouth daily.     clopidogrel 75 MG tablet  Commonly known as:  PLAVIX  Take 75 mg by mouth daily.     gabapentin 300 MG capsule  Commonly known as:  NEURONTIN  Take 300 mg by mouth 4 (four) times daily.     glimepiride 2 MG tablet  Commonly known as:  AMARYL  Take 2 mg by mouth 2 (two) times daily.     HYDROcodone-acetaminophen 5-325 MG per tablet  Commonly known as:  NORCO  Take 1 tablet by mouth every 6 (six) hours as needed for moderate pain.     hydroxychloroquine 200 MG tablet  Commonly known as:  PLAQUENIL  Take 200 mg by mouth 2 (two) times daily.     inFLIXimab 100 MG injection  Commonly known as:  REMICADE  Inject 100 mg into the vein every 8 (eight) weeks.     lansoprazole 15 MG capsule  Commonly known as:  PREVACID  Take 15 mg by mouth 3 (three) times daily.     loratadine 10 MG tablet  Commonly known as:  CLARITIN  Take  10 mg by mouth daily.     losartan 25 MG tablet  Commonly known as:  COZAAR  Take 25 mg by mouth daily.     methocarbamol 500 MG tablet  Commonly known as:  ROBAXIN  Take 500 mg by mouth 2 (two) times daily.     PROBIOTIC DAILY PO  Take 1 capsule by mouth daily. Equate Brand Probiotic     ranitidine 150 MG tablet  Commonly known as:  ZANTAC  Take 150 mg by mouth daily.        Diagnostic Studies: Dg Shoulder Left  03/30/2014   CLINICAL DATA:  Postop reverse total shoulder arthroplasty  EXAM: LEFT SHOULDER - 2+ VIEW  COMPARISON:  None.  FINDINGS: Left shoulder replacement in satisfactory position alignment. No fracture or complication.  IMPRESSION: Satisfactory left shoulder replacement.  These results will be called to the ordering clinician or representative by the Radiologist Assistant, and communication documented in the PACS or zVision Dashboard.   Electronically Signed   By: Marlan Palau M.D.   On: 03/30/2014 11:43    Disposition: SNF        Follow-up Information    Follow up with Phyllis Whitefield,STEVEN R, MD. Call in 2 weeks.   Specialty:  Orthopedic Surgery   Why:  [phone]   Contact information:   [address] [place] [address] [phone]        Signed: Verlee Rossetti 04/02/2014, 7:14 AM

## 2014-04-02 NOTE — Discharge Instructions (Signed)
Patient may remove the sling and use the arm as tolerated.  No heavy lifting pushing or pulling with the arm.  Ok to use the arm for ADLs and balance  Keep the incision covered and dry for one week, then ok to get wet in the shower.  Follow up in the office in two weeks.  [phone]  Occupational Therapy and Physical Therapy for mobility and ADLs and AROM and AAROM for the left shoulder

## 2014-04-02 NOTE — Progress Notes (Signed)
Orthopedics Progress Note  Subjective: Minimal pain today.  Mobile with assistance  Objective:  Filed Vitals:   04/02/14 0617  BP: 108/88  Pulse: 86  Temp: 99.1 F (37.3 C)  Resp: 16    General: Awake and alert  Musculoskeletal: left shoulder dressing intact, no swelling, no erythema Neurovascularly intact  Lab Results  Component Value Date   WBC 7.2 03/22/2014   HGB 12.1 03/31/2014   HCT 37.6 03/31/2014   MCV 88.5 03/22/2014   PLT 184 03/22/2014       Component Value Date/Time   NA 138 03/31/2014 0455   K 4.1 03/31/2014 0455   CL 100 03/31/2014 0455   CO2 24 03/31/2014 0455   GLUCOSE 146* 03/31/2014 0455   BUN 11 03/31/2014 0455   CREATININE 0.68 03/31/2014 0455   CALCIUM 8.4 03/31/2014 0455   GFRNONAA 85* 03/31/2014 0455   GFRAA >90 03/31/2014 0455    Lab Results  Component Value Date   INR 0.90 04/28/2012    Assessment/Plan: POD #3 s/p Procedure(s): LEFT TOTAL REVERSE SHOULDER ARTHROPLASTY D/C planning to short term SNF as patient does not have anyone at the house with her  Almedia Balls. Ranell Patrick, MD 04/02/2014 7:11 AM

## 2014-04-03 ENCOUNTER — Encounter (HOSPITAL_COMMUNITY): Payer: Self-pay | Admitting: Orthopedic Surgery

## 2014-04-03 LAB — GLUCOSE, CAPILLARY
GLUCOSE-CAPILLARY: 145 mg/dL — AB (ref 70–99)
Glucose-Capillary: 119 mg/dL — ABNORMAL HIGH (ref 70–99)

## 2014-04-03 NOTE — Clinical Social Work Note (Signed)
Patient to discharge to [facility] SN[facility] today RN to call report to: [phone] Transportation: family  SNF requests patient to arrive at 2pm to complete paperwork.  CSW updated RN and patient.  Both are aware and agreeable to this plan of discharge.  Vickii Penna, LCSWA [phone]  Psychiatric & Orthopedics (5N 1-16) Clinical Social Worker

## 2014-04-03 NOTE — Clinical Social Work Psychosocial (Signed)
Clinical Social Work Department BRIEF PSYCHOSOCIAL ASSESSMENT 04/03/2014  Patient:  Shelia Barber, Shelia Barber     Account Number:  [id number]     Admit date:  03/30/2014  Clinical Social Worker:  Read Drivers  Date/Time:  04/03/2014 11:20 AM  Referred by:  Physician  Date Referred:  04/03/2014 Referred for  SNF Placement  Psychosocial assessment   Other Referral:   none   Interview type:  Patient Other interview type:   none    PSYCHOSOCIAL DATA Living Status:  ALONE Admitted from facility:   Level of care:   Primary support name:  Peyton Najjar Primary support relationship to patient:  FRIEND Degree of support available:   adequate    CURRENT CONCERNS Current Concerns  Post-Acute Placement   Other Concerns:    SOCIAL WORK ASSESSMENT / PLAN CSW assessed patient at bedside.  PT has evaluated patient for disposition needs. PT recommends SNF/STR at time of discharge.  Pt is aware and agreeable to this disposition. Pt lives alone and has limited support.  Pt states her main support is her friend, Peyton Najjar.  Pt also has a son that she reports being close to.  Pt first SNF choice is [facility] but is agreeable to SNF/STR search of [facility] as a backup to [employer].  Patient reports that she will not need transportation to SNF as her friend, Peyton Najjar will be transporting her.  Patient is aware that [facility] made a bed offer and that her bed will be available to her at 2pm today.  Per [facility], pt will be admitted to a semi-private room. Pt is aware and agreeable.  Pt and friend Peyton Najjar are oprimisitic regarding recovery and independence after recovery.  Pt anticipates returning to home alone once STR complete.   Assessment/plan status:  Psychosocial Support/Ongoing Assessment of Needs Other assessment/ plan:   FL2  PASARR   Information/referral to community resources:   SNF/STR    PATIENT'S/FAMILY'S RESPONSE TO PLAN OF CARE: Pt is agreeable to SNF/STR disposition.  Pt chooses  [facility].       Vickii Penna, LCSWA [phone]  Psychiatric & Orthopedics (5N 1-16) Clinical Social Worker

## 2014-04-03 NOTE — Progress Notes (Signed)
   Subjective: 4 Days Post-Op Procedure(s) (LRB): LEFT TOTAL REVERSE SHOULDER ARTHROPLASTY (Left)  Pt notes increased rom today C/o mild pain but slowly improving  Ready to d/c to SNF Patient reports pain as mild.  Objective:   VITALS:   Filed Vitals:   04/03/14 0653  BP: 123/62  Pulse: 85  Temp: 98.6 F (37 C)  Resp:     Left shoulder incision healing well nv intact distally No rashes or edema Improved rom  LABS No results for input(s): HGB, HCT, WBC, PLT in the last 72 hours.  No results for input(s): NA, K, BUN, CREATININE, GLUCOSE in the last 72 hours.   Assessment/Plan: 4 Days Post-Op Procedure(s) (LRB): LEFT TOTAL REVERSE SHOULDER ARTHROPLASTY (Left)  D/c to rehab today F/u in 2 weeks Pt in agreement    [employer], MPAS, PA-C  04/03/2014, 11:08 AM

## 2014-04-03 NOTE — Clinical Social Work Placement (Signed)
Clinical Social Work Department CLINICAL SOCIAL WORK PLACEMENT NOTE 04/03/2014  Patient:  RAYNISHA, AVILLA  Account Number:  [id number] Admit date:  03/30/2014  Clinical Social Worker:  Read Drivers  Date/time:  04/02/2014 11:35 AM  Clinical Social Work is seeking post-discharge placement for this patient at the following level of care:   SKILLED NURSING   (*CSW will update this form in Epic as items are completed)   04/02/2014  Patient/family provided with [facility] Department of Clinical Social Work's list of facilities offering this level of care within the geographic area requested by the patient (or if unable, by the patient's family).  04/02/2014  Patient/family informed of their freedom to choose among providers that offer the needed level of care, that participate in Medicare, Medicaid or managed care program needed by the patient, have an available bed and are willing to accept the patient.  04/02/2014  Patient/family informed of [facility]' ownership interest in [facility], as well as of the fact that they are under no obligation to receive care at this facility.  PASARR submitted to EDS on 04/02/2014 PASARR number received on 04/02/2014  FL2 transmitted to all facilities in geographic area requested by pt/family on  04/02/2014 FL2 transmitted to all facilities within larger geographic area on   Patient informed that his/her managed care company has contracts with or will negotiate with  certain facilities, including the following:     Patient/family informed of bed offers received:  04/03/2014 Patient chooses bed at [facility] AND [facility] Physician recommends and patient chooses bed at    Patient to be transferred to [facility] on  04/03/2014 Patient to be transferred to facility by friend, Peyton Najjar Patient and family notified of transfer on 04/03/2014 Name of family member notified:  Toy Baker and patient both  notified by CSW  The following physician request were entered in Epic:   Additional Comments:   Vickii Penna, LCSWA [phone]  Psychiatric & Orthopedics (5N 1-16) Clinical Social Worker

## 2014-04-03 NOTE — Progress Notes (Signed)
Occupational Therapy Treatment Patient Details Name: KAHLIE DEUTSCHER MRN: [id number] DOB: 10-29-1940 Today's Date: 04/03/2014    History of present illness 73 y.o. left reverse TSA.   OT comments  Pt. Progressing well with shoulder and other rom exercises on LUE.  Edema is well managed.   Follow Up Recommendations  SNF;Supervision/Assistance - 24 hour    Equipment Recommendations       Recommendations for Other Services      Precautions / Restrictions Precautions Precautions: Fall;Shoulder Type of Shoulder Precautions: ADL education; sling education, pendulums; elbow, wrist hand AROM; Active/Assist shoulder ROM within limits of pain; Rehabilitation per Dr Ranell Patrick' protocol. Precaution Comments: educated on precautions Required Braces or Orthoses: Sling Restrictions Weight Bearing Restrictions: Yes LUE Weight Bearing: Non weight bearing       Mobility Bed Mobility                  Transfers                      Balance                                   ADL                                                Vision                     Perception     Praxis      Cognition                             Extremity/Trunk Assessment               Exercises Shoulder Exercises Pendulum Exercise: Left;10 reps;Standing;AROM Other Exercises Other Exercises: Performed approximately 10 reps of A/AAROM left elbow flexion/extension, approximately 10 reps of AROM wrist flexion/extension, and approximately 10 reps of digit composite flexion/extension. Few reps of neck flexion/extension and lateral flexion Other Exercises: approximately 10 reps of left AROM supination/pronation Pendulum exercises (written home exercise program): Supervision/safety ROM for elbow, wrist and digits of operated UE: Supervision/safety   Shoulder Instructions Shoulder Instructions Pendulum exercises (written home exercise  program): Supervision/safety ROM for elbow, wrist and digits of operated UE: Supervision/safety     General Comments      Pertinent Vitals/ Pain          Home Living                                          Prior Functioning/Environment              Frequency       Progress Toward Goals  OT Goals(current goals can now be found in the care plan section)  Progress towards OT goals: Progressing toward goals     Plan Discharge plan remains appropriate    Co-evaluation                 End of Session     Activity Tolerance Patient tolerated treatment well   Patient Left in chair;with call bell/phone within reach   Nurse Communication  Time: 4696-2952 OT Time Calculation (min): 15 min  Charges: OT General Charges $OT Visit: 1 Procedure OT Treatments $Therapeutic Exercise: 8-22 mins  Robet Leu, COTA/L 04/03/2014, 9:23 AM

## 2014-04-03 NOTE — Plan of Care (Signed)
Problem: Consults Goal: Shoulder Surgery Patient Education See Patient Education Module for education specifics.  Outcome: Completed/Met Date Met:  04/03/14 Goal: Diagnosis - Shoulder Surgery Reverse Total Shoulder Arthroplasty Goal: Skin Care Protocol Initiated - if Braden Score 18 or less If consults are not indicated, leave blank or document N/A  Outcome: Completed/Met Date Met:  04/03/14 Goal: Nutrition Consult-if indicated Outcome: Not Applicable Date Met:  00/86/76 Goal: Diabetes Guidelines if Diabetic/Glucose > 140 If diabetic or lab glucose is > 140 mg/dl - Initiate Diabetes/Hyperglycemia Guidelines & Document Interventions  Outcome: Completed/Met Date Met:  04/03/14

## 2014-04-19 ENCOUNTER — Emergency Department (HOSPITAL_COMMUNITY): Payer: Medicare Other

## 2014-04-19 ENCOUNTER — Emergency Department (HOSPITAL_COMMUNITY)
Admission: EM | Admit: 2014-04-19 | Discharge: 2014-04-19 | Disposition: A | Discharge status: Home / Self Care | Payer: Medicare Other | Attending: Emergency Medicine | Admitting: Emergency Medicine

## 2014-04-19 ENCOUNTER — Encounter (HOSPITAL_COMMUNITY): Payer: Self-pay | Admitting: Emergency Medicine

## 2014-04-19 DIAGNOSIS — K2970 Gastritis, unspecified, without bleeding: Secondary | ICD-10-CM | POA: Diagnosis not present

## 2014-04-19 DIAGNOSIS — Z8673 Personal history of transient ischemic attack (TIA), and cerebral infarction without residual deficits: Secondary | ICD-10-CM | POA: Insufficient documentation

## 2014-04-19 DIAGNOSIS — M25519 Pain in unspecified shoulder: Secondary | ICD-10-CM

## 2014-04-19 DIAGNOSIS — Z8669 Personal history of other diseases of the nervous system and sense organs: Secondary | ICD-10-CM | POA: Diagnosis not present

## 2014-04-19 DIAGNOSIS — Z87448 Personal history of other diseases of urinary system: Secondary | ICD-10-CM | POA: Insufficient documentation

## 2014-04-19 DIAGNOSIS — M069 Rheumatoid arthritis, unspecified: Secondary | ICD-10-CM | POA: Diagnosis not present

## 2014-04-19 DIAGNOSIS — Z79899 Other long term (current) drug therapy: Secondary | ICD-10-CM | POA: Insufficient documentation

## 2014-04-19 DIAGNOSIS — M24312 Pathological dislocation of left shoulder, not elsewhere classified: Secondary | ICD-10-CM | POA: Insufficient documentation

## 2014-04-19 DIAGNOSIS — I10 Essential (primary) hypertension: Secondary | ICD-10-CM | POA: Insufficient documentation

## 2014-04-19 DIAGNOSIS — E119 Type 2 diabetes mellitus without complications: Secondary | ICD-10-CM | POA: Insufficient documentation

## 2014-04-19 DIAGNOSIS — Z9889 Other specified postprocedural states: Secondary | ICD-10-CM | POA: Diagnosis not present

## 2014-04-19 DIAGNOSIS — Z86010 Personal history of colonic polyps: Secondary | ICD-10-CM | POA: Diagnosis not present

## 2014-04-19 DIAGNOSIS — K219 Gastro-esophageal reflux disease without esophagitis: Secondary | ICD-10-CM | POA: Insufficient documentation

## 2014-04-19 DIAGNOSIS — M25512 Pain in left shoulder: Secondary | ICD-10-CM | POA: Diagnosis present

## 2014-04-19 DIAGNOSIS — G8929 Other chronic pain: Secondary | ICD-10-CM | POA: Insufficient documentation

## 2014-04-19 DIAGNOSIS — E785 Hyperlipidemia, unspecified: Secondary | ICD-10-CM | POA: Insufficient documentation

## 2014-04-19 DIAGNOSIS — S43006A Unspecified dislocation of unspecified shoulder joint, initial encounter: Secondary | ICD-10-CM

## 2014-04-19 MED ORDER — KETAMINE HCL 10 MG/ML IJ SOLN
0.5000 mg/kg | Freq: Once | INTRAMUSCULAR | Status: AC
  Administered 2014-04-19: 75 mg via INTRAVENOUS
  Filled 2014-04-19: qty 5.7

## 2014-04-19 MED ORDER — SODIUM CHLORIDE 0.9 % IV SOLN
INTRAVENOUS | Status: DC
  Administered 2014-04-19: 10:00:00 via INTRAVENOUS

## 2014-04-19 MED ORDER — SODIUM CHLORIDE 0.9 % IV BOLUS (SEPSIS)
500.0000 mL | Freq: Once | INTRAVENOUS | Status: AC
  Administered 2014-04-19: 500 mL via INTRAVENOUS

## 2014-04-19 MED ORDER — PROPOFOL 10 MG/ML IV BOLUS
0.5000 mg/kg | Freq: Once | INTRAVENOUS | Status: AC
  Administered 2014-04-19: 5 mg via INTRAVENOUS
  Filled 2014-04-19: qty 20

## 2014-04-19 MED ORDER — ONDANSETRON HCL 4 MG/2ML IJ SOLN
4.0000 mg | Freq: Once | INTRAMUSCULAR | Status: AC
  Administered 2014-04-19: 4 mg via INTRAVENOUS
  Filled 2014-04-19: qty 2

## 2014-04-19 MED ORDER — FENTANYL CITRATE 0.05 MG/ML IJ SOLN
100.0000 ug | Freq: Once | INTRAMUSCULAR | Status: AC
  Administered 2014-04-19: 100 ug via INTRAVENOUS
  Filled 2014-04-19: qty 2

## 2014-04-19 MED ORDER — KETAMINE HCL 10 MG/ML IJ SOLN
INTRAMUSCULAR | Status: AC | PRN
  Administered 2014-04-19: 75 mg via INTRAVENOUS

## 2014-04-19 NOTE — ED Provider Notes (Signed)
CSN: [id number]     Arrival date & time 04/19/14  5170 History   None    Chief Complaint  Patient presents with  . Shoulder Pain     (Consider location/radiation/quality/duration/timing/severity/associated sxs/prior Treatment) HPI   Shelia Barber is a 73 y.o. female here for evaluation of left shoulder pain which started spontaneously, this morning, sitting and reading a newspaper.  Shelia Barber has been having daily left shoulder pain since total joint replacement, 03/30/2014.  Shelia Barber saw Shelia Barber orthopedist, one week ago, and at that time was doing well relative to healing from the surgery.  Shelia Barber denies any specific trauma to cause the pain this morning.  Shelia Barber denies headache, neck pain, back pain, weakness or dizziness.  Shelia Barber is taking Shelia Barber usual medications including Norco, without relief.  There are no other known modifying factors   Past Medical History  Diagnosis Date  . Gastritis   . Sleep apnea   . Osteoporosis   . Neuropathy   . Diverticulosis   . Hyperlipidemia     takes Atorvastatin daily  . Diabetes mellitus without complication     takes Amaryl daily  . Osteoarthritis     takes Plaquenil daily  . RA (rheumatoid arthritis)     Remicade every 8wks   . Hypertension     takes Losartan daily  . Seasonal allergies     takes Claritin daily  . GERD (gastroesophageal reflux disease)     takes Prevacid and Zantac daily  . History of colon polyps   . History of neck surgery 1993  . Cyst of kidney, acquired     right  . Peripheral neuropathy   . TIA (transient ischemic attack)     x 3 and takes Plavix daily  . Migraines     frequent headaches recently but not migraines;last migraine a few months ago  . Joint pain   . Joint swelling   . Chronic back pain     DDD,scoliosis  . H/O hiatal hernia   . Urinary frequency   . Urinary urgency    Past Surgical History  Procedure Laterality Date  . Cesarean section  1963/1966  . Cardiac catheterization  04/29/2012    30% proximal LAD  lesion, otherwise normal coronaries; LVEF 55% and no WMAs   . Tubal ligation  1966  . Appendectomy  1963  . Foot surgery Right 1968  . Colonoscopy    . Spurs removed from feet Bilateral 1994  . Abdominal hysterectomy  1994  . Carpal tunnel release Bilateral 1995  . Cholecystectomy  2002  . Cyst removed from kidney Right 2002  . Cystoscopy  2002  . Cataract surgery Bilateral 2010  . Knee surgery Left 2010    d/t torn cartliage  . Joint replacement  2010    left knee  . Total shoulder arthroplasty Left 03/30/2014    Procedure: LEFT TOTAL REVERSE SHOULDER ARTHROPLASTY;  Surgeon: Verlee Rossetti, MD;  Location: [facility] OR;  Service: Orthopedics;  Laterality: Left;   History reviewed. No pertinent family history. History  Substance Use Topics  . Smoking status: Never Smoker   . Smokeless tobacco: Not on file  . Alcohol Use: No   OB History    No data available     Review of Systems  All other systems reviewed and are negative.     Allergies  Ivp dye; Aspirin; and Imodium  Home Medications   Prior to Admission medications   Medication Sig Start Date End Date Taking?  Authorizing Provider  atorvastatin (LIPITOR) 20 MG tablet Take 20 mg by mouth daily.   Yes Historical Provider, MD  cetirizine (ZYRTEC) 10 MG tablet Take 10 mg by mouth daily.   Yes Historical Provider, MD  clopidogrel (PLAVIX) 75 MG tablet Take 75 mg by mouth daily.   Yes Historical Provider, MD  gabapentin (NEURONTIN) 300 MG capsule Take 300 mg by mouth 4 (four) times daily.  04/26/12  Yes Historical Provider, MD  glimepiride (AMARYL) 2 MG tablet Take 2 mg by mouth 2 (two) times daily.   Yes Historical Provider, MD  HYDROcodone-acetaminophen (NORCO) 5-325 MG per tablet Take 1 tablet by mouth every 6 (six) hours as needed for moderate pain. 03/30/14  Yes Verlee Rossetti, MD  hydroxychloroquine (PLAQUENIL) 200 MG tablet Take 200 mg by mouth 2 (two) times daily.   Yes Historical Provider, MD  inFLIXimab (REMICADE) 100  MG injection Inject 100 mg into the vein every 8 (eight) weeks.   Yes Historical Provider, MD  lansoprazole (PREVACID) 15 MG capsule Take 15 mg by mouth 3 (three) times a week. Every Monday, Wednesday, friday   Yes Historical Provider, MD  loratadine (CLARITIN) 10 MG tablet Take 10 mg by mouth daily.   Yes Historical Provider, MD  losartan (COZAAR) 25 MG tablet Take 25 mg by mouth daily.   Yes Historical Provider, MD  metFORMIN (GLUCOPHAGE) 1000 MG tablet Take 1,000 mg by mouth 2 (two) times daily with a meal.   Yes Historical Provider, MD  methocarbamol (ROBAXIN) 500 MG tablet Take 500 mg by mouth 2 (two) times daily.   Yes Historical Provider, MD  Probiotic Product (PROBIOTIC DAILY PO) Take 1 capsule by mouth daily. Equate Brand Probiotic   Yes Historical Provider, MD  ranitidine (ZANTAC) 150 MG tablet Take 150 mg by mouth daily.   Yes Historical Provider, MD   BP 137/64 mmHg  Pulse 65  Temp(Src) 97.7 F (36.5 C) (Oral)  Resp 21  Ht  (1.549 m)  Wt 250 lb (113.399 kg)  BMI 47.26 kg/m2  SpO2 100% Physical Exam  Constitutional: Shelia Barber is oriented to person, place, and time. Shelia Barber appears well-developed and well-nourished.  HENT:  Head: Normocephalic and atraumatic.  Right Ear: External ear normal.  Left Ear: External ear normal.  Eyes: Conjunctivae and EOM are normal. Pupils are equal, round, and reactive to light.  Neck: Normal range of motion and phonation normal. Neck supple.  Cardiovascular: Normal rate.   Pulmonary/Chest: Breath sounds normal. Shelia Barber exhibits no bony tenderness.  Abdominal: Soft.  Musculoskeletal:  Wearing left arm immobilizer.  Left shoulder is moderately swollen without appreciable subacromial defect.  There is a healing anterior shoulder wound, consistent with recent surgery without signs for infection.  Shelia Barber is neurovascular intact distally in the left hand.  Neurological: Shelia Barber is alert and oriented to person, place, and time. No cranial nerve deficit or sensory  deficit. Shelia Barber exhibits normal muscle tone. Coordination normal.  Skin: Skin is warm, dry and intact.  Psychiatric: Shelia Barber has a normal mood and affect. Shelia Barber behavior is normal. Judgment and thought content normal.  Nursing note and vitals reviewed.   ED Course  Procedures (including critical care time) Medications  0.9 %  sodium chloride infusion ( Intravenous New Bag/Given 04/19/14 1001)  ketamine (KETALAR) injection (75 mg Intravenous Given 04/19/14 1459)  fentaNYL (SUBLIMAZE) injection 100 mcg (100 mcg Intravenous Given 04/19/14 1002)  ondansetron (ZOFRAN) injection 4 mg (4 mg Intravenous Given 04/19/14 1001)  fentaNYL (SUBLIMAZE) injection 100 mcg (  100 mcg Intravenous Given 04/19/14 1055)  sodium chloride 0.9 % bolus 500 mL (0 mLs Intravenous Stopped 04/19/14 1200)  ketamine (KETALAR) injection 57 mg (75 mg Intravenous Given 04/19/14 1315)  propofol (DIPRIVAN) 10 mg/mL bolus/IV push 56.7 mg (0 mg/kg  113.4 kg Intravenous Stopped 04/19/14 1503)    Patient Vitals for the past 24 hrs:  BP Temp Temp src Pulse Resp SpO2 Height Weight  04/19/14 1545 137/64 mmHg - - 65 21 100 % - -  04/19/14 1530 124/70 mmHg - - 65 22 100 % - -  04/19/14 1515 116/73 mmHg - - 68 20 100 % - -  04/19/14 1511 (!) 130/53 mmHg - - 73 21 100 % - -  04/19/14 1505 140/75 mmHg - - 73 23 98 % - -  04/19/14 1500 165/68 mmHg - - 82 17 94 % - -  04/19/14 1457 126/55 mmHg - - 62 23 100 % - -  04/19/14 1430 (!) 114/49 mmHg - - 63 14 100 % - -  04/19/14 1415 (!) 105/53 mmHg - - 62 13 100 % - -  04/19/14 1400 (!) 120/48 mmHg - - 63 12 100 % - -  04/19/14 1345 (!) 124/53 mmHg - - 70 18 100 % - -  04/19/14 1335 124/62 mmHg - - 69 25 100 % - -  04/19/14 1330 138/70 mmHg - - 74 23 100 % - -  04/19/14 1325 152/68 mmHg - - 83 20 100 % - -  04/19/14 1320 165/74 mmHg - - 80 18 99 % - -  04/19/14 1305 110/58 mmHg - - 63 11 100 % - -  04/19/14 1234 (!) 116/54 mmHg - - - - - - -  04/19/14 1210 (!) 107/44 mmHg - - 63 16 100 % - -   04/19/14 1130 (!) 100/47 mmHg - - 66 16 99 % - -  04/19/14 1115 107/77 mmHg - - 64 16 100 % - -  04/19/14 1047 (!) 98/33 mmHg - - 66 16 98 % - -  04/19/14 0944 - - - - - - 5\' 1"  (1.549 m) 250 lb (113.399 kg)  04/19/14 0942 136/65 mmHg 97.7 F (36.5 C) Oral 70 16 100 % - -   11:28- case discussed with orthopedics, Dr. Despina Hick; feels like the shoulder has a anterior dislocation/subluxation, that will easily reduce with gentle traction.  He requests that ED staff perform this reduction.  I agreed to attempt.  Reevaluation: 14:00- viewed post reduction film, the prosthetic shoulder is still dislocated.  Patient is uncomfortable.  Procedural sedation Performed by: Flint Melter Consent: Verbal consent obtained. Risks and benefits: risks, benefits and alternatives were discussed Required items: required blood products, implants, devices, and special equipment available Patient identity confirmed: arm band and provided demographic data Time out: Immediately prior to procedure a "time out" was called to verify the correct patient, procedure, equipment, support staff and site/side marked as required.  Sedation type: moderate (conscious) sedation NPO time confirmed and considedered  Sedatives: KETAMINE   Physician Time at Bedside: 20 minutes  Vitals: Vital signs were monitored during sedation. Cardiac Monitor, pulse oximeter Patient tolerance: Patient tolerated the procedure well with no immediate complications. Comments: Pt with uneventful recovered. Returned to pre-procedural sedation baseline  Reduction of dislocation Date/Time: 3:56 PM Performed by: Flint Melter Authorized by: Flint Melter Consent: Verbal consent obtained. Risks and benefits: risks, benefits and alternatives were discussed Consent given by: patient Required items: required blood products, implants, devices, and  special equipment available Time out: Immediately prior to procedure a "time out" was called to  verify the correct patient, procedure, equipment, support staff and site/side marked as required.  Patient sedated:   Vitals: Vital signs were monitored during sedation. Patient tolerance: Patient tolerated the procedure well with no immediate complications. Joint: Left shoulder Reduction technique: flexion with external rotation     15:00- Second sedation required- Dr. Despina Hick, performed the reduction, this time.  Prereduction and sedation exam same as before earlier one.  Procedural sedation Performed by: Flint Melter Consent: Verbal consent obtained. Risks and benefits: risks, benefits and alternatives were discussed Required items: required blood products, implants, devices, and special equipment available Patient identity confirmed: arm band and provided demographic data Time out: Immediately prior to procedure a "time out" was called to verify the correct patient, procedure, equipment, support staff and site/side marked as required.  Sedation type: moderate (conscious) sedation NPO time confirmed and considedered  Sedatives: PROPOFOL and ketamine  Physician Time at Bedside: 15 minutes  Vitals: Vital signs were monitored during sedation. Cardiac Monitor, pulse oximeter Patient tolerance: Patient tolerated the procedure well with no immediate complications. Comments: Pt with uneventful recovered. Returned to pre-procedural sedation baseline      3:56 PM Reevaluation with update and discussion. After initial assessment and treatment, an updated evaluation reveals Shelia Barber is alert, calm and cooperative.  Vital signs are normal.  Shelia Barber states Shelia Barber shoulder discomfort has improved.Mancel Bale L    Labs Review Labs Reviewed - No data to display  Imaging Review Dg Shoulder Left  04/19/2014   CLINICAL DATA:  Left shoulder surgery on Nov 6; woke up this morning and felt stiffer than usual, Shelia Barber reports sitting on couch reading the paper and heard a pop. Felt a movement in Shelia Barber  shoulder and then pain. Shelia Barber applied the sling Shelia Barber had from surgery to immobilize it. Shelia Barber states pain with any movement. Positive radial pulse, hand warm, sensation intact. Will not allow arm to be moved at this time. Axillary was not obtained due to pt condition.  EXAM: LEFT SHOULDER - 2+ VIEW  COMPARISON:  03/30/2014  FINDINGS: Positioning for imaging is nonstandard. There is dislocation of the shoulder arthroplasty. No acute fracture. Degenerative changes are noted at the acromioclavicular joint.  IMPRESSION: Dislocation of the shoulder arthroplasty.   Electronically Signed   By: Rosalie Gums M.D.   On: 04/19/2014 10:50   Dg Shoulder Left Port  04/19/2014   CLINICAL DATA:  73 year old female with history of severely subluxed left shoulder (with left shoulder arthroplasty).  EXAM: LEFT SHOULDER - 1 VIEW  COMPARISON:  04/19/2014.  FINDINGS: Postreduction film demonstrates apparent normal articulation between the glenoid and proximal humeral prosthesis. No periprosthetic fracture is noted.  IMPRESSION: 1. Successful relocation of the left shoulder in this patient status post left shoulder arthroplasty.   Electronically Signed   By: Trudie Reed M.D.   On: 04/19/2014 15:30   Dg Shoulder Left Port  04/19/2014   CLINICAL DATA:  Post reduction attempt  EXAM: LEFT SHOULDER - 1 VIEW  COMPARISON:  Study obtained earlier in the day  FINDINGS: The apparent subluxation at the level of the left total shoulder arthroplasty is again noted. On this single view, it is difficult to ascertain whether there has been an insignificant change in the overall alignment of the humeral component with respect to the glenoid component. No fracture is seen.  IMPRESSION: On this single view, there appears to remain subluxation if not frank dislocation  at the total shoulder replacement prosthesis. No fracture.   Electronically Signed   By: Bretta Bang M.D.   On: 04/19/2014 13:56     EKG Interpretation None      MDM    Final diagnoses:  Dislocated shoulder    Dislocated shoulder prosthesis, reduced on second attempt.  No fracture or other complication from procedure.  Nursing Notes Reviewed/ Care Coordinated Applicable Imaging Reviewed Interpretation of Laboratory Data incorporated into ED treatment  The patient appears reasonably screened and/or stabilized for discharge and I doubt any other medical condition or other [facility] requiring further screening, evaluation, or treatment in the ED at this time prior to discharge.  Plan: Home Medications- usual; Home Treatments- Shoulder immobilizer; return here if the recommended treatment, does not improve the symptoms; Recommended follow up- Ortho f/u asap     Flint Melter, MD 04/19/14 1557

## 2014-04-19 NOTE — ED Notes (Signed)
Patient transported to X-ray 

## 2014-04-19 NOTE — Sedation Documentation (Signed)
Propofol 68ml per Dr. Effie Shy

## 2014-04-19 NOTE — ED Notes (Signed)
X-ray arrived for Portable exam.

## 2014-04-19 NOTE — Consult Note (Signed)
Reason for Consult:Left shoulder dislocation Referring Physician: Dr. [name] is an 73 y.o. female.  HPI: Shelia Barber is a 73 yo female 3 weeks s/p Left reverse total shoulder arthroplasty by Dr. Beverely Low. She was doing well until this morning when she was sitting up reading and felt a sudden pop in her left shoulder. She had immediate pain and inability to raise her arm. She did not have any numbness or tingling in her left arm. She presented to the [facility] ED with a dislocated left shoulder replacement. She had attempted closed reduction x 1 without success. I was consulted to manage the dislocation.  Past Medical History  Diagnosis Date  . Gastritis   . Sleep apnea   . Osteoporosis   . Neuropathy   . Diverticulosis   . Hyperlipidemia     takes Atorvastatin daily  . Diabetes mellitus without complication     takes Amaryl daily  . Osteoarthritis     takes Plaquenil daily  . RA (rheumatoid arthritis)     Remicade every 8wks   . Hypertension     takes Losartan daily  . Seasonal allergies     takes Claritin daily  . GERD (gastroesophageal reflux disease)     takes Prevacid and Zantac daily  . History of colon polyps   . History of neck surgery 1993  . Cyst of kidney, acquired     right  . Peripheral neuropathy   . TIA (transient ischemic attack)     x 3 and takes Plavix daily  . Migraines     frequent headaches recently but not migraines;last migraine a few months ago  . Joint pain   . Joint swelling   . Chronic back pain     DDD,scoliosis  . H/O hiatal hernia   . Urinary frequency   . Urinary urgency     Past Surgical History  Procedure Laterality Date  . Cesarean section  1963/1966  . Cardiac catheterization  04/29/2012    30% proximal LAD lesion, otherwise normal coronaries; LVEF 55% and no WMAs   . Tubal ligation  1966  . Appendectomy  1963  . Foot surgery Right 1968  . Colonoscopy    . Spurs removed from feet Bilateral 1994  . Abdominal  hysterectomy  1994  . Carpal tunnel release Bilateral 1995  . Cholecystectomy  2002  . Cyst removed from kidney Right 2002  . Cystoscopy  2002  . Cataract surgery Bilateral 2010  . Knee surgery Left 2010    d/t torn cartliage  . Joint replacement  2010    left knee  . Total shoulder arthroplasty Left 03/30/2014    Procedure: LEFT TOTAL REVERSE SHOULDER ARTHROPLASTY;  Surgeon: Verlee Rossetti, MD;  Location: [facility] OR;  Service: Orthopedics;  Laterality: Left;    History reviewed. No pertinent family history.  Social History:  reports that she has never smoked. She does not have any smokeless tobacco history on file. She reports that she does not drink alcohol or use illicit drugs.  Allergies:  Allergies  Allergen Reactions  . Ivp Dye [Iodinated Diagnostic Agents] Anaphylaxis  . Aspirin Other (See Comments)    Upset stomach Pt does take coated baby aspirin  . Imodium [Loperamide] Other (See Comments)    Due to Diverticulosis    Medications: I have reviewed the patient's current medications.  No results found for this or any previous visit (from the past 48 hour(s)).  Dg Shoulder Left  04/19/2014   CLINICAL DATA:  Left shoulder surgery on Nov 6; woke up this morning and felt stiffer than usual, she reports sitting on couch reading the paper and heard a pop. Felt a movement in her shoulder and then pain. She applied the sling she had from surgery to immobilize it. She states pain with any movement. Positive radial pulse, hand warm, sensation intact. Will not allow arm to be moved at this time. Axillary was not obtained due to pt condition.  EXAM: LEFT SHOULDER - 2+ VIEW  COMPARISON:  03/30/2014  FINDINGS: Positioning for imaging is nonstandard. There is dislocation of the shoulder arthroplasty. No acute fracture. Degenerative changes are noted at the acromioclavicular joint.  IMPRESSION: Dislocation of the shoulder arthroplasty.   Electronically Signed   By: Rosalie Gums M.D.   On:  04/19/2014 10:50   Dg Shoulder Left Port  04/19/2014   CLINICAL DATA:  Post reduction attempt  EXAM: LEFT SHOULDER - 1 VIEW  COMPARISON:  Study obtained earlier in the day  FINDINGS: The apparent subluxation at the level of the left total shoulder arthroplasty is again noted. On this single view, it is difficult to ascertain whether there has been an insignificant change in the overall alignment of the humeral component with respect to the glenoid component. No fracture is seen.  IMPRESSION: On this single view, there appears to remain subluxation if not [name] dislocation at the total shoulder replacement prosthesis. No fracture.   Electronically Signed   By: Bretta Bang M.D.   On: 04/19/2014 13:56    ROS Blood pressure 140/75, pulse 73, temperature 97.7 F (36.5 C), temperature source Oral, resp. rate 23, height 5\' 1"  (1.549 m), weight 250 lb (113.399 kg), SpO2 98 %. Physical Exam  Wd female alert and oriented in NAD Well healed left shoulder incision Tender anterior shoulder with palpable deformity  Neurovascular intact LUE Reluctant to move elbow secondary to shoulder pain  X-ray- Left reverse TSA with anterior dislocation. No fracture.  Procedure- After informed consent and administration of conscious sedation with Ketamine and Propofol a closed reduction was performed with palpable reduction of the shoulder. I was able to rotate 30 degrees internal and external and abduct/elevat 70 degrees without instability. She was placed back into the shoulder immobilizer.  Post reduction radiographs- TSA concentrically reduced. No fractures.   Assessment/Plan: Left prosthetic shoulder dislocation. Reduced and placed in sling. Keep sling on at all times. Call [phone] Monday morning to make an appointment with Dr. Saturday. Patient has analgesics at home so no new prescriptions are provided.   Shelia Barber 04/19/2014, 3:08 PM

## 2014-04-19 NOTE — ED Notes (Signed)
Patient returned from X-ray 

## 2014-04-19 NOTE — ED Notes (Signed)
Left shoulder surgery on Nov 6; woke up this morning and felt stiffer than usual, she reports sitting on couch reading the paper and heard a pop. Felt a movement in her shoulder and then pain. She applied the sling she had from surgery to immobilize it. She states pain with any movement. Positive radial pulse, hand warm, sensation intact. Will not allow arm to be moved at this time.

## 2014-04-19 NOTE — Discharge Instructions (Signed)
Wear the shoulder immobilizer at all times, except when changing clothing.   Shoulder Dislocation Your shoulder is made up of three bones: the collar bone (clavicle); the shoulder blade (scapula), which includes the socket (glenoid cavity); and the upper arm bone (humerus). Your shoulder joint is the place where these bones meet. Strong, fibrous tissues hold these bones together (ligaments). Muscles and strong, fibrous tissues that connect the muscles to these bones (tendons) allow your arm to move through this joint. The range of motion of your shoulder joint is more extensive than most of your other joints, and the glenoid cavity is very shallow. That is the reason that your shoulder joint is one of the most unstable joints in your body. It is far more prone to dislocation than your other joints. Shoulder dislocation is when your humerus is forced out of your shoulder joint. CAUSES Shoulder dislocation is caused by a forceful impact on your shoulder. This impact usually is from an injury, such as a sports injury or a fall. SYMPTOMS Symptoms of shoulder dislocation include:  Deformity of your shoulder.  Intense pain.  Inability to move your shoulder joint.  Numbness, weakness, or tingling around your shoulder joint (your neck or down your arm).  Bruising or swelling around your shoulder. DIAGNOSIS In order to diagnose a dislocated shoulder, your caregiver will perform a physical exam. Your caregiver also may have an X-ray exam done to see if you have any broken bones. Magnetic resonance imaging (MRI) is a procedure that sometimes is done to help your caregiver see any damage to the soft tissues around your shoulder, particularly your rotator cuff tendons. Additionally, your caregiver also may have electromyography done to measure the electrical discharges produced in your muscles if you have signs or symptoms of nerve damage. TREATMENT A shoulder dislocation is treated by placing the humerus  back in the joint (reduction). Your caregiver does this either manually (closed reduction), by moving your humerus back into the joint through manipulation, or through surgery (open reduction). When your humerus is back in place, severe pain should improve almost immediately. You also may need to have surgery if you have a weak shoulder joint or ligaments, and you have recurring shoulder dislocations, despite rehabilitation. In rare cases, surgery is necessary if your nerves or blood vessels are damaged during the dislocation. After your reduction, your arm will be placed in a shoulder immobilizer or sling to keep it from moving. Your caregiver will have you wear your shoulder immobilizer or sling for 3 days to 3 weeks, depending on how serious your dislocation is. When your shoulder immobilizer or sling is removed, your caregiver may prescribe physical therapy to help improve the range of motion in your shoulder joint. HOME CARE INSTRUCTIONS  The following measures can help to reduce pain and speed up the healing process:  Rest your injured joint. Do not move it. Avoid activities similar to the one that caused your injury.  Apply ice to your injured joint for the first day or two after your reduction or as directed by your caregiver. Applying ice helps to reduce inflammation and pain.  Put ice in a plastic bag.  Place a towel between your skin and the bag.  Leave the ice on for 15-20 minutes at a time, every 2 hours while you are awake.  Exercise your hand by squeezing a soft ball. This helps to eliminate stiffness and swelling in your hand and wrist.  Take over-the-counter or prescription medicine for pain or discomfort  as told by your caregiver. SEEK IMMEDIATE MEDICAL CARE IF:   Your shoulder immobilizer or sling becomes damaged.  Your pain becomes worse rather than better.  You lose feeling in your arm or hand, or they become white and cold. MAKE SURE YOU:   Understand these  instructions.  Will watch your condition.  Will get help right away if you are not doing well or get worse. Document Released: 02/03/2001 Document Revised: 09/25/2013 Document Reviewed: 03/01/2011 [facility] Patient Information 2015 [place], [place]. This information is not intended to replace advice given to you by your health care provider. Make sure you discuss any questions you have with your health care provider.

## 2014-04-28 ENCOUNTER — Emergency Department (HOSPITAL_COMMUNITY): Payer: Medicare Other | Admitting: Anesthesiology

## 2014-04-28 ENCOUNTER — Observation Stay (HOSPITAL_COMMUNITY): Payer: Medicare Other

## 2014-04-28 ENCOUNTER — Encounter (HOSPITAL_COMMUNITY)
Admission: EM | Disposition: A | Discharge status: Home / Self Care | Payer: Self-pay | Source: Home / Self Care | Attending: Emergency Medicine

## 2014-04-28 ENCOUNTER — Emergency Department (HOSPITAL_COMMUNITY): Payer: Medicare Other

## 2014-04-28 ENCOUNTER — Observation Stay (HOSPITAL_COMMUNITY)
Admission: EM | Admit: 2014-04-28 | Discharge: 2014-04-29 | Disposition: A | Discharge status: Home / Self Care | Payer: Medicare Other | Attending: Orthopedic Surgery | Admitting: Orthopedic Surgery

## 2014-04-28 ENCOUNTER — Encounter (HOSPITAL_COMMUNITY): Payer: Self-pay | Admitting: Emergency Medicine

## 2014-04-28 DIAGNOSIS — Z86010 Personal history of colonic polyps: Secondary | ICD-10-CM | POA: Insufficient documentation

## 2014-04-28 DIAGNOSIS — G4730 Sleep apnea, unspecified: Secondary | ICD-10-CM | POA: Diagnosis not present

## 2014-04-28 DIAGNOSIS — M810 Age-related osteoporosis without current pathological fracture: Secondary | ICD-10-CM | POA: Insufficient documentation

## 2014-04-28 DIAGNOSIS — G43909 Migraine, unspecified, not intractable, without status migrainosus: Secondary | ICD-10-CM | POA: Insufficient documentation

## 2014-04-28 DIAGNOSIS — E785 Hyperlipidemia, unspecified: Secondary | ICD-10-CM | POA: Insufficient documentation

## 2014-04-28 DIAGNOSIS — Y792 Prosthetic and other implants, materials and accessory orthopedic devices associated with adverse incidents: Secondary | ICD-10-CM | POA: Diagnosis not present

## 2014-04-28 DIAGNOSIS — Z8673 Personal history of transient ischemic attack (TIA), and cerebral infarction without residual deficits: Secondary | ICD-10-CM | POA: Diagnosis not present

## 2014-04-28 DIAGNOSIS — M419 Scoliosis, unspecified: Secondary | ICD-10-CM | POA: Insufficient documentation

## 2014-04-28 DIAGNOSIS — K219 Gastro-esophageal reflux disease without esophagitis: Secondary | ICD-10-CM | POA: Insufficient documentation

## 2014-04-28 DIAGNOSIS — N281 Cyst of kidney, acquired: Secondary | ICD-10-CM | POA: Diagnosis not present

## 2014-04-28 DIAGNOSIS — T84028A Dislocation of other internal joint prosthesis, initial encounter: Principal | ICD-10-CM | POA: Insufficient documentation

## 2014-04-28 DIAGNOSIS — I10 Essential (primary) hypertension: Secondary | ICD-10-CM | POA: Diagnosis not present

## 2014-04-28 DIAGNOSIS — M1990 Unspecified osteoarthritis, unspecified site: Secondary | ICD-10-CM | POA: Insufficient documentation

## 2014-04-28 DIAGNOSIS — E119 Type 2 diabetes mellitus without complications: Secondary | ICD-10-CM | POA: Insufficient documentation

## 2014-04-28 DIAGNOSIS — Z886 Allergy status to analgesic agent status: Secondary | ICD-10-CM | POA: Insufficient documentation

## 2014-04-28 DIAGNOSIS — Z91041 Radiographic dye allergy status: Secondary | ICD-10-CM | POA: Diagnosis not present

## 2014-04-28 DIAGNOSIS — Z96612 Presence of left artificial shoulder joint: Secondary | ICD-10-CM | POA: Diagnosis not present

## 2014-04-28 DIAGNOSIS — G629 Polyneuropathy, unspecified: Secondary | ICD-10-CM | POA: Diagnosis not present

## 2014-04-28 DIAGNOSIS — M25519 Pain in unspecified shoulder: Secondary | ICD-10-CM

## 2014-04-28 DIAGNOSIS — Z888 Allergy status to other drugs, medicaments and biological substances status: Secondary | ICD-10-CM | POA: Diagnosis not present

## 2014-04-28 DIAGNOSIS — S43005A Unspecified dislocation of left shoulder joint, initial encounter: Secondary | ICD-10-CM

## 2014-04-28 DIAGNOSIS — M069 Rheumatoid arthritis, unspecified: Secondary | ICD-10-CM | POA: Insufficient documentation

## 2014-04-28 DIAGNOSIS — K449 Diaphragmatic hernia without obstruction or gangrene: Secondary | ICD-10-CM | POA: Insufficient documentation

## 2014-04-28 DIAGNOSIS — M24419 Recurrent dislocation, unspecified shoulder: Secondary | ICD-10-CM | POA: Diagnosis present

## 2014-04-28 DIAGNOSIS — M24412 Recurrent dislocation, left shoulder: Secondary | ICD-10-CM

## 2014-04-28 HISTORY — PX: REVERSE SHOULDER ARTHROPLASTY: SHX5054

## 2014-04-28 LAB — TYPE AND SCREEN
ABO/RH(D): O POS
Antibody Screen: NEGATIVE

## 2014-04-28 LAB — BASIC METABOLIC PANEL
ANION GAP: 14 (ref 5–15)
BUN: 8 mg/dL (ref 6–23)
CHLORIDE: 101 meq/L (ref 96–112)
CO2: 26 meq/L (ref 19–32)
CREATININE: 0.6 mg/dL (ref 0.50–1.10)
Calcium: 9.1 mg/dL (ref 8.4–10.5)
GFR calc non Af Amer: 89 mL/min — ABNORMAL LOW (ref >90)
Glucose, Bld: 82 mg/dL (ref 70–99)
Potassium: 3.8 mEq/L (ref 3.7–5.3)
Sodium: 141 mEq/L (ref 137–147)

## 2014-04-28 LAB — PROTIME-INR
INR: 1 (ref 0.00–1.49)
PROTHROMBIN TIME: 13.3 s (ref 11.6–15.2)

## 2014-04-28 LAB — CBC WITH DIFFERENTIAL/PLATELET
Basophils Absolute: 0 10*3/uL (ref 0.0–0.1)
Basophils Relative: 0 % (ref 0–1)
Eosinophils Absolute: 0.4 10*3/uL (ref 0.0–0.7)
Eosinophils Relative: 7 % — ABNORMAL HIGH (ref 0–5)
HEMATOCRIT: 36.5 % (ref 36.0–46.0)
Hemoglobin: 11.4 g/dL — ABNORMAL LOW (ref 12.0–15.0)
Lymphocytes Relative: 36 % (ref 12–46)
Lymphs Abs: 1.8 10*3/uL (ref 0.7–4.0)
MCH: 27.6 pg (ref 26.0–34.0)
MCHC: 31.2 g/dL (ref 30.0–36.0)
MCV: 88.4 fL (ref 78.0–100.0)
MONO ABS: 0.4 10*3/uL (ref 0.1–1.0)
Monocytes Relative: 8 % (ref 3–12)
NEUTROS ABS: 2.4 10*3/uL (ref 1.7–7.7)
Neutrophils Relative %: 49 % (ref 43–77)
Platelets: 178 10*3/uL (ref 150–400)
RBC: 4.13 MIL/uL (ref 3.87–5.11)
RDW: 15.6 % — ABNORMAL HIGH (ref 11.5–15.5)
WBC: 5 10*3/uL (ref 4.0–10.5)

## 2014-04-28 LAB — GLUCOSE, CAPILLARY
Glucose-Capillary: 82 mg/dL (ref 70–99)
Glucose-Capillary: 103 mg/dL — ABNORMAL HIGH (ref 70–99)

## 2014-04-28 LAB — APTT: aPTT: 30 seconds (ref 24–37)

## 2014-04-28 LAB — ABO/RH: ABO/RH(D): O POS

## 2014-04-28 SURGERY — ARTHROPLASTY, SHOULDER, TOTAL, REVERSE
Anesthesia: General | Site: Shoulder | Laterality: Left

## 2014-04-28 MED ORDER — LORATADINE 10 MG PO TABS: 10.0000 mg | ORAL_TABLET | Freq: Every day | ORAL | Status: DC

## 2014-04-28 MED ORDER — METOCLOPRAMIDE HCL 10 MG PO TABS: 5.0000 mg | ORAL_TABLET | Freq: Three times a day (TID) | ORAL | Status: DC | PRN

## 2014-04-28 MED ORDER — GLIMEPIRIDE 2 MG PO TABS
2.0000 mg | ORAL_TABLET | Freq: Two times a day (BID) | ORAL | Status: DC
  Administered 2014-04-29: 2 mg via ORAL
  Filled 2014-04-28 (×3): qty 1

## 2014-04-28 MED ORDER — MORPHINE SULFATE 4 MG/ML IJ SOLN
4.0000 mg | Freq: Once | INTRAMUSCULAR | Status: AC
Start: 2014-04-28 — End: 2014-04-28
  Administered 2014-04-28: 4 mg via INTRAVENOUS
  Filled 2014-04-28: qty 1

## 2014-04-28 MED ORDER — SODIUM CHLORIDE 0.9 % IV SOLN
INTRAVENOUS | Status: DC
  Administered 2014-04-28: 11:00:00 via INTRAVENOUS

## 2014-04-28 MED ORDER — METHOCARBAMOL 500 MG PO TABS
500.0000 mg | ORAL_TABLET | Freq: Four times a day (QID) | ORAL | Status: DC | PRN
  Filled 2014-04-28: qty 1

## 2014-04-28 MED ORDER — MIDAZOLAM HCL 5 MG/5ML IJ SOLN
INTRAMUSCULAR | Status: DC | PRN
  Administered 2014-04-28: 2 mg via INTRAVENOUS

## 2014-04-28 MED ORDER — MORPHINE SULFATE 4 MG/ML IJ SOLN
4.0000 mg | Freq: Once | INTRAMUSCULAR | Status: AC
  Administered 2014-04-28: 4 mg via INTRAVENOUS
  Filled 2014-04-28: qty 1

## 2014-04-28 MED ORDER — ONDANSETRON HCL 4 MG PO TABS: 4.0000 mg | ORAL_TABLET | Freq: Four times a day (QID) | ORAL | Status: DC | PRN

## 2014-04-28 MED ORDER — PHENYLEPHRINE 40 MCG/ML (10ML) SYRINGE FOR IV PUSH (FOR BLOOD PRESSURE SUPPORT)
PREFILLED_SYRINGE | INTRAVENOUS | Status: AC
  Filled 2014-04-28: qty 10

## 2014-04-28 MED ORDER — METHOCARBAMOL 1000 MG/10ML IJ SOLN
500.0000 mg | Freq: Four times a day (QID) | INTRAVENOUS | Status: DC | PRN
  Administered 2014-04-28: 500 mg via INTRAVENOUS
  Filled 2014-04-28: qty 5

## 2014-04-28 MED ORDER — METFORMIN HCL 500 MG PO TABS
1000.0000 mg | ORAL_TABLET | Freq: Two times a day (BID) | ORAL | Status: DC
  Administered 2014-04-29: 1000 mg via ORAL
  Filled 2014-04-28 (×3): qty 2

## 2014-04-28 MED ORDER — HYDROMORPHONE HCL 1 MG/ML IJ SOLN
0.2500 mg | INTRAMUSCULAR | Status: DC | PRN
  Administered 2014-04-28 (×4): 0.5 mg via INTRAVENOUS

## 2014-04-28 MED ORDER — ONDANSETRON HCL 4 MG/2ML IJ SOLN
4.0000 mg | Freq: Four times a day (QID) | INTRAMUSCULAR | Status: DC | PRN
  Administered 2014-04-29: 4 mg via INTRAVENOUS
  Filled 2014-04-28: qty 2

## 2014-04-28 MED ORDER — HYDROMORPHONE HCL 1 MG/ML IJ SOLN
INTRAMUSCULAR | Status: AC
  Filled 2014-04-28: qty 1

## 2014-04-28 MED ORDER — OXYCODONE HCL 5 MG PO TABS
ORAL_TABLET | ORAL | Status: AC
  Filled 2014-04-28: qty 1

## 2014-04-28 MED ORDER — CEFAZOLIN SODIUM-DEXTROSE 2-3 GM-% IV SOLR
INTRAVENOUS | Status: DC | PRN
  Administered 2014-04-28: 2 g via INTRAVENOUS

## 2014-04-28 MED ORDER — METHOCARBAMOL 500 MG PO TABS
500.0000 mg | ORAL_TABLET | Freq: Two times a day (BID) | ORAL | Status: DC
  Administered 2014-04-29: 500 mg via ORAL
  Filled 2014-04-28 (×2): qty 1

## 2014-04-28 MED ORDER — ACETAMINOPHEN 325 MG PO TABS: 650.0000 mg | ORAL_TABLET | Freq: Four times a day (QID) | ORAL | Status: DC | PRN

## 2014-04-28 MED ORDER — CEFAZOLIN SODIUM-DEXTROSE 2-3 GM-% IV SOLR
INTRAVENOUS | Status: AC
  Filled 2014-04-28: qty 50

## 2014-04-28 MED ORDER — OXYCODONE HCL 5 MG PO TABS
5.0000 mg | ORAL_TABLET | Freq: Once | ORAL | Status: AC | PRN
  Administered 2014-04-28: 5 mg via ORAL

## 2014-04-28 MED ORDER — METOCLOPRAMIDE HCL 5 MG/ML IJ SOLN: 5.0000 mg | Freq: Three times a day (TID) | INTRAMUSCULAR | Status: DC | PRN

## 2014-04-28 MED ORDER — PHENOL 1.4 % MT LIQD: 1.0000 | OROMUCOSAL | Status: DC | PRN

## 2014-04-28 MED ORDER — MEPERIDINE HCL 25 MG/ML IJ SOLN: 6.2500 mg | INTRAMUSCULAR | Status: DC | PRN

## 2014-04-28 MED ORDER — ONDANSETRON HCL 4 MG/2ML IJ SOLN
4.0000 mg | Freq: Once | INTRAMUSCULAR | Status: AC
  Administered 2014-04-28: 4 mg via INTRAVENOUS

## 2014-04-28 MED ORDER — OXYCODONE HCL 5 MG/5ML PO SOLN: 5.0000 mg | Freq: Once | ORAL | Status: AC | PRN

## 2014-04-28 MED ORDER — FENTANYL CITRATE 0.05 MG/ML IJ SOLN
INTRAMUSCULAR | Status: AC
  Filled 2014-04-28: qty 5

## 2014-04-28 MED ORDER — PHENYLEPHRINE HCL 10 MG/ML IJ SOLN
INTRAMUSCULAR | Status: DC | PRN
  Administered 2014-04-28 (×3): 80 ug via INTRAVENOUS

## 2014-04-28 MED ORDER — NEOSTIGMINE METHYLSULFATE 10 MG/10ML IV SOLN
INTRAVENOUS | Status: AC
  Filled 2014-04-28: qty 1

## 2014-04-28 MED ORDER — LORATADINE 10 MG PO TABS
10.0000 mg | ORAL_TABLET | Freq: Every day | ORAL | Status: DC
  Filled 2014-04-28: qty 1

## 2014-04-28 MED ORDER — INFLIXIMAB 100 MG IV SOLR: 100.0000 mg | INTRAVENOUS | Status: DC

## 2014-04-28 MED ORDER — PANTOPRAZOLE SODIUM 40 MG PO TBEC
40.0000 mg | DELAYED_RELEASE_TABLET | Freq: Every day | ORAL | Status: DC
Start: 2014-04-29 — End: 2014-04-29

## 2014-04-28 MED ORDER — FAMOTIDINE 20 MG PO TABS
20.0000 mg | ORAL_TABLET | Freq: Every day | ORAL | Status: DC
  Administered 2014-04-29: 20 mg via ORAL
  Filled 2014-04-28: qty 1

## 2014-04-28 MED ORDER — EPHEDRINE SULFATE 50 MG/ML IJ SOLN
INTRAMUSCULAR | Status: DC | PRN
  Administered 2014-04-28: 10 mg via INTRAVENOUS
  Administered 2014-04-28: 5 mg via INTRAVENOUS

## 2014-04-28 MED ORDER — ROCURONIUM BROMIDE 50 MG/5ML IV SOLN
INTRAVENOUS | Status: AC
  Filled 2014-04-28: qty 1

## 2014-04-28 MED ORDER — ONDANSETRON HCL 4 MG/2ML IJ SOLN
4.0000 mg | Freq: Once | INTRAMUSCULAR | Status: DC | PRN
Start: 2014-04-28 — End: 2014-04-28

## 2014-04-28 MED ORDER — OXYCODONE HCL 5 MG PO TABS
5.0000 mg | ORAL_TABLET | ORAL | Status: DC | PRN
  Administered 2014-04-29: 10 mg via ORAL
  Filled 2014-04-28: qty 2

## 2014-04-28 MED ORDER — LIDOCAINE HCL (CARDIAC) 20 MG/ML IV SOLN
INTRAVENOUS | Status: AC
  Filled 2014-04-28: qty 5

## 2014-04-28 MED ORDER — MIDAZOLAM HCL 2 MG/2ML IJ SOLN
INTRAMUSCULAR | Status: AC
  Filled 2014-04-28: qty 2

## 2014-04-28 MED ORDER — PROPOFOL 10 MG/ML IV BOLUS
INTRAVENOUS | Status: DC | PRN
  Administered 2014-04-28: 120 mg via INTRAVENOUS

## 2014-04-28 MED ORDER — PROPOFOL 10 MG/ML IV BOLUS
INTRAVENOUS | Status: AC
  Filled 2014-04-28: qty 20

## 2014-04-28 MED ORDER — ONDANSETRON HCL 4 MG/2ML IJ SOLN
INTRAMUSCULAR | Status: AC
  Filled 2014-04-28: qty 2

## 2014-04-28 MED ORDER — HYDROCODONE-ACETAMINOPHEN 5-325 MG PO TABS
1.0000 | ORAL_TABLET | Freq: Four times a day (QID) | ORAL | Status: DC | PRN
  Administered 2014-04-29: 1 via ORAL
  Filled 2014-04-28: qty 1

## 2014-04-28 MED ORDER — HYDROMORPHONE HCL 1 MG/ML IJ SOLN
0.2500 mg | INTRAMUSCULAR | Status: DC | PRN
  Administered 2014-04-28: 0.5 mg via INTRAVENOUS

## 2014-04-28 MED ORDER — SODIUM CHLORIDE 0.9 % IR SOLN
Status: DC | PRN
  Administered 2014-04-28: 3000 mL

## 2014-04-28 MED ORDER — ACETAMINOPHEN 650 MG RE SUPP: 650.0000 mg | Freq: Four times a day (QID) | RECTAL | Status: DC | PRN

## 2014-04-28 MED ORDER — ATORVASTATIN CALCIUM 20 MG PO TABS
20.0000 mg | ORAL_TABLET | Freq: Every day | ORAL | Status: DC
  Administered 2014-04-29: 20 mg via ORAL
  Filled 2014-04-28: qty 1

## 2014-04-28 MED ORDER — VECURONIUM BROMIDE 10 MG IV SOLR
INTRAVENOUS | Status: AC
  Filled 2014-04-28: qty 10

## 2014-04-28 MED ORDER — ONDANSETRON HCL 4 MG/2ML IJ SOLN
INTRAMUSCULAR | Status: DC | PRN
  Administered 2014-04-28: 4 mg via INTRAVENOUS

## 2014-04-28 MED ORDER — HYDROXYCHLOROQUINE SULFATE 200 MG PO TABS
200.0000 mg | ORAL_TABLET | Freq: Two times a day (BID) | ORAL | Status: DC
  Administered 2014-04-29 (×2): 200 mg via ORAL
  Filled 2014-04-28 (×3): qty 1

## 2014-04-28 MED ORDER — MORPHINE SULFATE 4 MG/ML IJ SOLN
4.0000 mg | Freq: Once | INTRAMUSCULAR | Status: AC
  Administered 2014-04-28: 4 mg via INTRAVENOUS

## 2014-04-28 MED ORDER — FENTANYL CITRATE 0.05 MG/ML IJ SOLN
INTRAMUSCULAR | Status: DC | PRN
  Administered 2014-04-28: 150 ug via INTRAVENOUS
  Administered 2014-04-28: 100 ug via INTRAVENOUS

## 2014-04-28 MED ORDER — CLOPIDOGREL BISULFATE 75 MG PO TABS
75.0000 mg | ORAL_TABLET | Freq: Every day | ORAL | Status: DC
  Administered 2014-04-29: 75 mg via ORAL
  Filled 2014-04-28: qty 1

## 2014-04-28 MED ORDER — POLYETHYLENE GLYCOL 3350 17 G PO PACK: 17.0000 g | PACK | Freq: Every day | ORAL | Status: DC | PRN

## 2014-04-28 MED ORDER — GABAPENTIN 300 MG PO CAPS
300.0000 mg | ORAL_CAPSULE | Freq: Four times a day (QID) | ORAL | Status: DC
  Administered 2014-04-29 (×2): 300 mg via ORAL
  Filled 2014-04-28 (×5): qty 1

## 2014-04-28 MED ORDER — SODIUM CHLORIDE 0.9 % IJ SOLN
INTRAMUSCULAR | Status: AC
  Filled 2014-04-28: qty 10

## 2014-04-28 MED ORDER — LIDOCAINE HCL (CARDIAC) 20 MG/ML IV SOLN
INTRAVENOUS | Status: DC | PRN
  Administered 2014-04-28: 75 mg via INTRAVENOUS

## 2014-04-28 MED ORDER — LOSARTAN POTASSIUM 25 MG PO TABS
25.0000 mg | ORAL_TABLET | Freq: Every day | ORAL | Status: DC
  Administered 2014-04-29: 25 mg via ORAL
  Filled 2014-04-28: qty 1

## 2014-04-28 MED ORDER — GLYCOPYRROLATE 0.2 MG/ML IJ SOLN
INTRAMUSCULAR | Status: AC
  Filled 2014-04-28: qty 3

## 2014-04-28 MED ORDER — CEFAZOLIN SODIUM-DEXTROSE 2-3 GM-% IV SOLR
2.0000 g | Freq: Four times a day (QID) | INTRAVENOUS | Status: DC
Start: 2014-04-28 — End: 2014-04-29
  Administered 2014-04-29: 2 g via INTRAVENOUS
  Filled 2014-04-28: qty 50

## 2014-04-28 MED ORDER — LACTATED RINGERS IV SOLN
INTRAVENOUS | Status: DC | PRN
  Administered 2014-04-28: 21:00:00 via INTRAVENOUS

## 2014-04-28 MED ORDER — MENTHOL 3 MG MT LOZG: 1.0000 | LOZENGE | OROMUCOSAL | Status: DC | PRN

## 2014-04-28 MED ORDER — SODIUM CHLORIDE 0.9 % IV SOLN
INTRAVENOUS | Status: DC
  Administered 2014-04-29: via INTRAVENOUS

## 2014-04-28 MED ORDER — SUCCINYLCHOLINE CHLORIDE 20 MG/ML IJ SOLN
INTRAMUSCULAR | Status: AC
  Filled 2014-04-28: qty 1

## 2014-04-28 MED ORDER — HYDROMORPHONE HCL 1 MG/ML IJ SOLN
0.5000 mg | INTRAMUSCULAR | Status: DC | PRN
  Administered 2014-04-29 (×3): 1 mg via INTRAVENOUS
  Filled 2014-04-28 (×3): qty 1

## 2014-04-28 MED ORDER — SUCCINYLCHOLINE CHLORIDE 20 MG/ML IJ SOLN
INTRAMUSCULAR | Status: DC | PRN
  Administered 2014-04-28: 140 mg via INTRAVENOUS

## 2014-04-28 SURGICAL SUPPLY — 46 items
CONT SPEC 4OZ CLIKSEAL STRL BL (MISCELLANEOUS) ×3 IMPLANT
COVER SURGICAL LIGHT HANDLE (MISCELLANEOUS) ×3 IMPLANT
DRAPE INCISE IOBAN 66X45 STRL (DRAPES) ×9 IMPLANT
DRAPE U-SHAPE 47X51 STRL (DRAPES) ×3 IMPLANT
DRAPE X-RAY CASS 24X20 (DRAPES) IMPLANT
DRSG ADAPTIC 3X8 NADH LF (GAUZE/BANDAGES/DRESSINGS) ×3 IMPLANT
DRSG PAD ABDOMINAL 8X10 ST (GAUZE/BANDAGES/DRESSINGS) ×3 IMPLANT
DURAPREP 26ML APPLICATOR (WOUND CARE) ×3 IMPLANT
ELECT REM PT RETURN 9FT ADLT (ELECTROSURGICAL) ×3
ELECTRODE REM PT RTRN 9FT ADLT (ELECTROSURGICAL) ×1 IMPLANT
GAUZE SPONGE 4X4 12PLY STRL (GAUZE/BANDAGES/DRESSINGS) ×3 IMPLANT
GLOVE BIOGEL PI ORTHO PRO 7.5 (GLOVE) ×2
GLOVE BIOGEL PI ORTHO PRO SZ8 (GLOVE) ×2
GLOVE ORTHO TXT STRL SZ7.5 (GLOVE) ×3 IMPLANT
GLOVE PI ORTHO PRO STRL 7.5 (GLOVE) ×1 IMPLANT
GLOVE PI ORTHO PRO STRL SZ8 (GLOVE) ×1 IMPLANT
GLOVE SURG ORTHO 8.5 STRL (GLOVE) ×3 IMPLANT
GOWN STRL REUS W/ TWL LRG LVL3 (GOWN DISPOSABLE) ×1 IMPLANT
GOWN STRL REUS W/ TWL XL LVL3 (GOWN DISPOSABLE) ×2 IMPLANT
GOWN STRL REUS W/TWL LRG LVL3 (GOWN DISPOSABLE) ×3
GOWN STRL REUS W/TWL XL LVL3 (GOWN DISPOSABLE) ×6
HANDPIECE INTERPULSE COAX TIP (DISPOSABLE)
HUMERAL SPACER (Trauma) ×3 IMPLANT
KIT BASIN OR (CUSTOM PROCEDURE TRAY) ×3 IMPLANT
KIT ROOM TURNOVER OR (KITS) ×3 IMPLANT
MANIFOLD NEPTUNE II (INSTRUMENTS) ×3 IMPLANT
NS IRRIG 1000ML POUR BTL (IV SOLUTION) ×3 IMPLANT
PACK SHOULDER (CUSTOM PROCEDURE TRAY) ×3 IMPLANT
PAD ARMBOARD 7.5X6 YLW CONV (MISCELLANEOUS) ×6 IMPLANT
SET HNDPC FAN SPRY TIP SCT (DISPOSABLE) IMPLANT
SLING ARM LRG ADULT FOAM STRAP (SOFTGOODS) ×3 IMPLANT
SLING ARM MED ADULT FOAM STRAP (SOFTGOODS) IMPLANT
SPACER 38 PLUS 3 (Spacer) ×3 IMPLANT
SPACER HUMERAL (Trauma) ×1 IMPLANT
SPONGE GAUZE 4X4 12PLY STER LF (GAUZE/BANDAGES/DRESSINGS) ×3 IMPLANT
SPONGE LAP 18X18 X RAY DECT (DISPOSABLE) ×3 IMPLANT
SPONGE LAP 4X18 X RAY DECT (DISPOSABLE) ×3 IMPLANT
STAPLER VISISTAT 35W (STAPLE) ×3 IMPLANT
SUCTION FRAZIER TIP 10 FR DISP (SUCTIONS) ×3 IMPLANT
SUT VIC AB 2-0 CT1 27 (SUTURE) ×6
SUT VIC AB 2-0 CT1 TAPERPNT 27 (SUTURE) ×2 IMPLANT
SUT VICRYL 0 CT 1 36IN (SUTURE) ×3 IMPLANT
TAPE CLOTH SURG 6X10 WHT LF (GAUZE/BANDAGES/DRESSINGS) ×3 IMPLANT
TOWEL OR 17X24 6PK STRL BLUE (TOWEL DISPOSABLE) ×3 IMPLANT
TOWEL OR 17X26 10 PK STRL BLUE (TOWEL DISPOSABLE) ×3 IMPLANT
YANKAUER SUCT BULB TIP NO VENT (SUCTIONS) IMPLANT

## 2014-04-28 NOTE — ED Notes (Signed)
Patient returned from X-ray 

## 2014-04-28 NOTE — Transfer of Care (Signed)
Immediate Anesthesia Transfer of Care Note  Patient: Shelia Barber  Procedure(s) Performed: Procedure(s): IRRIGATION AND DEBRIDEMENT SHOULDER WITH POLY EXCHANGE (Left)  Patient Location: PACU  Anesthesia Type:General  Level of Consciousness: awake, alert  and oriented  Airway & Oxygen Therapy: Patient Spontanous Breathing and Patient connected to nasal cannula oxygen  Post-op Assessment: Report given to PACU RN and Post -op Vital signs reviewed and stable  Post vital signs: Reviewed and stable  Complications: No apparent anesthesia complications

## 2014-04-28 NOTE — Brief Op Note (Signed)
04/28/2014  10:08 PM  PATIENT:  Shelia Barber  73 y.o. female  PRE-OPERATIVE DIAGNOSIS:  dislocated left total shoulder  POST-OPERATIVE DIAGNOSIS:  dislocated left total shoulder  PROCEDURE:  Procedure(s): IRRIGATION AND DEBRIDEMENT SHOULDER WITH POLY EXCHANGE (Left)  SURGEON:  Surgeon(s) and Role:    * Verlee Rossetti, MD - Primary  PHYSICIAN ASSISTANT:   ASSISTANTS: Thea Gist, PA-C   ANESTHESIA:   general  EBL:  Total I/O In: 1250 [I.V.:1250] Out: 25 [Blood:25]  BLOOD ADMINISTERED:none  DRAINS: none   LOCAL MEDICATIONS USED:  NONE  SPECIMEN:  No Specimen  DISPOSITION OF SPECIMEN:  N/A  COUNTS:  YES  TOURNIQUET:  * No tourniquets in log *  DICTATION: .Other Dictation: Dictation Number [id number]  PLAN OF CARE: Admit for overnight observation  PATIENT DISPOSITION:  PACU - hemodynamically stable.   Delay start of Pharmacological VTE agent (>24hrs) due to surgical blood loss or risk of bleeding: not applicable

## 2014-04-28 NOTE — ED Provider Notes (Signed)
TIME SEEN: 10:35 AM  CHIEF COMPLAINT: Left shoulder pain  HPI: Pt is a 73 y.o. right-hand-dominant female with history of hypertension, diabetes, hyperlipidemia who recently underwent left shoulder replacement with Dr. Devonne Doughty on 03/30/14 and then subsequent anterior shoulder dislocation on 04/19/14 that was reduced by orthopedics in the emergency department who presents emergency department with complaints of left shoulder pain and feeling like her shoulder has dislocated again. She reports that she was trying to pull the lid off of her eyebrow pencil when she felt her left arm pop. Pain is worse with movement. Denies any numbness or tingling. States she last ate cereal at 8 AM this morning. Denies any fever, cough, chest pain or shortness of breath, vomiting or diarrhea.  ROS: See HPI Constitutional: no fever  Eyes: no drainage  ENT: no runny nose   Cardiovascular:  no chest pain  Resp: no SOB  GI: no vomiting GU: no dysuria Integumentary: no rash  Allergy: no hives  Musculoskeletal: no leg swelling  Neurological: no slurred speech ROS otherwise negative  PAST MEDICAL HISTORY/PAST SURGICAL HISTORY:  Past Medical History  Diagnosis Date  . Gastritis   . Sleep apnea   . Osteoporosis   . Neuropathy   . Diverticulosis   . Hyperlipidemia     takes Atorvastatin daily  . Diabetes mellitus without complication     takes Amaryl daily  . Osteoarthritis     takes Plaquenil daily  . RA (rheumatoid arthritis)     Remicade every 8wks   . Hypertension     takes Losartan daily  . Seasonal allergies     takes Claritin daily  . GERD (gastroesophageal reflux disease)     takes Prevacid and Zantac daily  . History of colon polyps   . History of neck surgery 1993  . Cyst of kidney, acquired     right  . Peripheral neuropathy   . TIA (transient ischemic attack)     x 3 and takes Plavix daily  . Migraines     frequent headaches recently but not migraines;last migraine a few months ago  .  Joint pain   . Joint swelling   . Chronic back pain     DDD,scoliosis  . H/O hiatal hernia   . Urinary frequency   . Urinary urgency     MEDICATIONS:  Prior to Admission medications   Medication Sig Start Date End Date Taking? Authorizing Provider  atorvastatin (LIPITOR) 20 MG tablet Take 20 mg by mouth daily.    Historical Provider, MD  cetirizine (ZYRTEC) 10 MG tablet Take 10 mg by mouth daily.    Historical Provider, MD  clopidogrel (PLAVIX) 75 MG tablet Take 75 mg by mouth daily.    Historical Provider, MD  gabapentin (NEURONTIN) 300 MG capsule Take 300 mg by mouth 4 (four) times daily.  04/26/12   Historical Provider, MD  glimepiride (AMARYL) 2 MG tablet Take 2 mg by mouth 2 (two) times daily.    Historical Provider, MD  HYDROcodone-acetaminophen (NORCO) 5-325 MG per tablet Take 1 tablet by mouth every 6 (six) hours as needed for moderate pain. 03/30/14   Verlee Rossetti, MD  hydroxychloroquine (PLAQUENIL) 200 MG tablet Take 200 mg by mouth 2 (two) times daily.    Historical Provider, MD  inFLIXimab (REMICADE) 100 MG injection Inject 100 mg into the vein every 8 (eight) weeks.    Historical Provider, MD  lansoprazole (PREVACID) 15 MG capsule Take 15 mg by mouth 3 (three) times a  week. Every Monday, Wednesday, friday    Historical Provider, MD  loratadine (CLARITIN) 10 MG tablet Take 10 mg by mouth daily.    Historical Provider, MD  losartan (COZAAR) 25 MG tablet Take 25 mg by mouth daily.    Historical Provider, MD  metFORMIN (GLUCOPHAGE) 1000 MG tablet Take 1,000 mg by mouth 2 (two) times daily with a meal.    Historical Provider, MD  methocarbamol (ROBAXIN) 500 MG tablet Take 500 mg by mouth 2 (two) times daily.    Historical Provider, MD  Probiotic Product (PROBIOTIC DAILY PO) Take 1 capsule by mouth daily. Equate Brand Probiotic    Historical Provider, MD  ranitidine (ZANTAC) 150 MG tablet Take 150 mg by mouth daily.    Historical Provider, MD    ALLERGIES:  Allergies  Allergen  Reactions  . Ivp Dye [Iodinated Diagnostic Agents] Anaphylaxis  . Aspirin Other (See Comments)    Upset stomach Pt does take coated baby aspirin  . Imodium [Loperamide] Other (See Comments)    Due to Diverticulosis    SOCIAL HISTORY:  History  Substance Use Topics  . Smoking status: Never Smoker   . Smokeless tobacco: Not on file  . Alcohol Use: No    FAMILY HISTORY: History reviewed. No pertinent family history.  EXAM: BP 140/72 mmHg  Pulse 84  Temp(Src) 97.7 F (36.5 C) (Oral)  Resp 16  SpO2 100% CONSTITUTIONAL: Alert and oriented and responds appropriately to questions. Well-appearing; well-nourished HEAD: Normocephalic EYES: Conjunctivae clear, PERRL ENT: normal nose; no rhinorrhea; moist mucous membranes; pharynx without lesions noted NECK: Supple, no meningismus, no LAD  CARD: RRR; S1 and S2 appreciated; no murmurs, no clicks, no rubs, no gallops RESP: Normal chest excursion without splinting or tachypnea; breath sounds clear and equal bilaterally; no wheezes, no rhonchi, no rales,  ABD/GI: Normal bowel sounds; non-distended; soft, non-tender, no rebound, no guarding BACK:  The back appears normal and is non-tender to palpation, there is no CVA tenderness EXT: Patient is obese which limits her exam she does appear to have a possible anterior shoulder dislocation with loss of fullness, there is a healing surgical scar over the anterior left shoulder, normal sensation diffusely throughout the left arm, normal range of motion in the left elbow and left wrist and left fingers, normal capillary refill, 2+ radial pulses bilaterally, normal grip strength on the left, otherwise Normal ROM in all joints; otherwise extremities are non-tender to palpation; no edema; normal capillary refill; no cyanosis    SKIN: Normal color for age and race; warm NEURO: Moves all extremities equally PSYCH: The patient's mood and manner are appropriate. Grooming and personal hygiene are  appropriate.  MEDICAL DECISION MAKING: Patient here with possible recurrent left shoulder dislocation after a shoulder replacement on November 6. Reports she was told by Dr. Ranell Patrick if her shoulder dislocated again that she would need another surgery. No other injury. Neurovascular intact distally. We'll give pain medication, keep patient nothing by mouth and obtain x-ray.  ED PROGRESS: 12:37 PM  Unable to view patient's x-ray secondary to downtime. Radiologist read shows another anterior shoulder prosthesis dislocation. No fracture. Will consult orthopedic on call for Dr. Ranell Patrick.   1:01 PM  Discussed with Dr. Ranell Patrick. He will see the patient in the ED. They will admit to take the patient to the OR. We'll keep her nothing by mouth. We'll obtain basic labs, type and screen.  Layla Maw Cristle Jared, DO 04/28/14 1301

## 2014-04-28 NOTE — Anesthesia Postprocedure Evaluation (Signed)
Anesthesia Post Note  Patient: Shelia Barber  Procedure(s) Performed: Procedure(s) (LRB): IRRIGATION AND DEBRIDEMENT SHOULDER WITH POLY EXCHANGE (Left)  Anesthesia type: general  Patient location: PACU  Post pain: Pain level controlled  Post assessment: Patient's Cardiovascular Status Stable  Last Vitals:  Filed Vitals:   04/28/14 2300  BP: 118/56  Pulse: 77  Temp: 36.5 C  Resp: 11    Post vital signs: Reviewed and stable  Level of consciousness: sedated  Complications: No apparent anesthesia complications

## 2014-04-28 NOTE — Anesthesia Procedure Notes (Signed)
Procedure Name: Intubation Date/Time: 04/28/2014 8:34 PM Performed by: Fallan Mccarey S Pre-anesthesia Checklist: Patient identified, Timeout performed, Emergency Drugs available, Suction available and Patient being monitored Patient Re-evaluated:Patient Re-evaluated prior to inductionOxygen Delivery Method: Circle system utilized Preoxygenation: Pre-oxygenation with 100% oxygen Intubation Type: IV induction Ventilation: Mask ventilation without difficulty Laryngoscope Size: Mac and 3 Grade View: Grade II Tube type: Oral Tube size: 7.5 mm Number of attempts: 1 Airway Equipment and Method: Stylet Placement Confirmation: ETT inserted through vocal cords under direct vision,  positive ETCO2 and breath sounds checked- equal and bilateral Secured at: 22 cm Tube secured with: Tape Dental Injury: Teeth and Oropharynx as per pre-operative assessment

## 2014-04-28 NOTE — Discharge Instructions (Signed)
Ice to the shoulder. Ok to remove the sling as you feel comfortable.  Keep arm propped so that the arm is across the waist when you can.  Keep incision clean and dry for one week, then ok to shower.  Ok to use the arm for light activity of daily living.  Follow up in two weeks in the office  [phone]

## 2014-04-28 NOTE — ED Notes (Signed)
Patient transported to X-ray 

## 2014-04-28 NOTE — Anesthesia Preprocedure Evaluation (Addendum)
Anesthesia Evaluation  Patient identified by MRN, date of birth, ID band Patient awake    Reviewed: Allergy & Precautions, H&P , NPO status , Patient's Chart, lab work & pertinent test results  History of Anesthesia Complications Negative for: history of anesthetic complications  Airway Mallampati: II  TM Distance: >3 FB Neck ROM: Full    Dental  (+) Teeth Intact, Dental Advisory Given   Pulmonary sleep apnea and Continuous Positive Airway Pressure Ventilation ,          Cardiovascular hypertension, Pt. on medications     Neuro/Psych  Headaches, TIA   GI/Hepatic hiatal hernia, GERD-  Medicated and Controlled,  Endo/Other  diabetes, Type 2, Oral Hypoglycemic Agents  Renal/GU Renal disease     Musculoskeletal  (+) Arthritis -, Osteoarthritis and Rheumatoid disorders,    Abdominal   Peds  Hematology   Anesthesia Other Findings   Reproductive/Obstetrics                          Anesthesia Physical Anesthesia Plan  ASA: III and emergent  Anesthesia Plan: General   Post-op Pain Management:    Induction: Intravenous  Airway Management Planned: Oral ETT  Additional Equipment:   Intra-op Plan:   Post-operative Plan: Extubation in OR  Informed Consent: I have reviewed the patients History and Physical, chart, labs and discussed the procedure including the risks, benefits and alternatives for the proposed anesthesia with the patient or authorized representative who has indicated his/her understanding and acceptance.   Dental advisory given  Plan Discussed with: CRNA and Surgeon  Anesthesia Plan Comments:        Anesthesia Quick Evaluation

## 2014-04-28 NOTE — ED Notes (Signed)
Getting dressed and putting on make-up this morning; pulled top of eye brow pencil off with left arm and felt a pop. Holding left arm with right arm; hurts with any movement. Surgery Nov 6th, dislocated Nov 26th, has had follow-up with ortho last week and repeat xray looked good. Ortho stated if it dislocated again, repeat surgery would be needed.

## 2014-04-28 NOTE — H&P (Signed)
Shelia Barber is an 73 y.o. female.   Chief Complaint: Left shoulder dislocation HPI: 73 yo female s/p reverse left total shoulder replacement one month ago with two recent dislocations. Patient reports no trauma to the shoulder and minimal exertion that resulted in the dislocation today. Complains of significant pain in the shoulder with the dislocation. No other complaints.  Past Medical History  Diagnosis Date  . Gastritis   . Sleep apnea   . Osteoporosis   . Neuropathy   . Diverticulosis   . Hyperlipidemia     takes Atorvastatin daily  . Diabetes mellitus without complication     takes Amaryl daily  . Osteoarthritis     takes Plaquenil daily  . RA (rheumatoid arthritis)     Remicade every 8wks   . Hypertension     takes Losartan daily  . Seasonal allergies     takes Claritin daily  . GERD (gastroesophageal reflux disease)     takes Prevacid and Zantac daily  . History of colon polyps   . History of neck surgery 1993  . Cyst of kidney, acquired     right  . Peripheral neuropathy   . TIA (transient ischemic attack)     x 3 and takes Plavix daily  . Migraines     frequent headaches recently but not migraines;last migraine a few months ago  . Joint pain   . Joint swelling   . Chronic back pain     DDD,scoliosis  . H/O hiatal hernia   . Urinary frequency   . Urinary urgency     Past Surgical History  Procedure Laterality Date  . Cesarean section  1963/1966  . Cardiac catheterization  04/29/2012    30% proximal LAD lesion, otherwise normal coronaries; LVEF 55% and no WMAs   . Tubal ligation  1966  . Appendectomy  1963  . Foot surgery Right 1968  . Colonoscopy    . Spurs removed from feet Bilateral 1994  . Abdominal hysterectomy  1994  . Carpal tunnel release Bilateral 1995  . Cholecystectomy  2002  . Cyst removed from kidney Right 2002  . Cystoscopy  2002  . Cataract surgery Bilateral 2010  . Knee surgery Left 2010    d/t torn cartliage  . Joint replacement   2010    left knee  . Total shoulder arthroplasty Left 03/30/2014    Procedure: LEFT TOTAL REVERSE SHOULDER ARTHROPLASTY;  Surgeon: Augustin Schooling, MD;  Location: [facility];  Service: Orthopedics;  Laterality: Left;    History reviewed. No pertinent family history. Social History:  reports that she has never smoked. She does not have any smokeless tobacco history on file. She reports that she does not drink alcohol or use illicit drugs.  Allergies:  Allergies  Allergen Reactions  . Ivp Dye [Iodinated Diagnostic Agents] Anaphylaxis  . Aspirin Other (See Comments)    Upset stomach Pt does take coated baby aspirin  . Imodium [Loperamide] Other (See Comments)    Due to Diverticulosis     (Not in a hospital admission)  Results for orders placed or performed during the hospital encounter of 04/28/14 (from the past 48 hour(s))  CBC with Differential     Status: Abnormal   Collection Time: 04/28/14 11:15 AM  Result Value Ref Range   WBC 5.0 4.0 - 10.5 K/uL   RBC 4.13 3.87 - 5.11 MIL/uL   Hemoglobin 11.4 (L) 12.0 - 15.0 g/dL   HCT 36.5 36.0 - 46.0 %  MCV 88.4 78.0 - 100.0 fL   MCH 27.6 26.0 - 34.0 pg   MCHC 31.2 30.0 - 36.0 g/dL   RDW 15.6 (H) 11.5 - 15.5 %   Platelets 178 150 - 400 K/uL   Neutrophils Relative % 49 43 - 77 %   Neutro Abs 2.4 1.7 - 7.7 K/uL   Lymphocytes Relative 36 12 - 46 %   Lymphs Abs 1.8 0.7 - 4.0 K/uL   Monocytes Relative 8 3 - 12 %   Monocytes Absolute 0.4 0.1 - 1.0 K/uL   Eosinophils Relative 7 (H) 0 - 5 %   Eosinophils Absolute 0.4 0.0 - 0.7 K/uL   Basophils Relative 0 0 - 1 %   Basophils Absolute 0.0 0.0 - 0.1 K/uL  Basic metabolic panel     Status: Abnormal   Collection Time: 04/28/14 11:15 AM  Result Value Ref Range   Sodium 141 137 - 147 mEq/L   Potassium 3.8 3.7 - 5.3 mEq/L   Chloride 101 96 - 112 mEq/L   CO2 26 19 - 32 mEq/L   Glucose, Bld 82 70 - 99 mg/dL   BUN 8 6 - 23 mg/dL   Creatinine, Ser 0.60 0.50 - 1.10 mg/dL   Calcium 9.1 8.4 - 10.5  mg/dL   GFR calc non Af Amer 89 (L) >90 mL/min   GFR calc Af Amer >90 >90 mL/min    Comment: (NOTE) The eGFR has been calculated using the CKD EPI equation. This calculation has not been validated in all clinical situations. eGFR's persistently <90 mL/min signify possible Chronic Kidney Disease.    Anion gap 14 5 - 15  APTT     Status: None   Collection Time: 04/28/14 11:15 AM  Result Value Ref Range   aPTT 30 24 - 37 seconds  Protime-INR     Status: None   Collection Time: 04/28/14 11:15 AM  Result Value Ref Range   Prothrombin Time 13.3 11.6 - 15.2 seconds   INR 1.00 0.00 - 1.49  Type and screen     Status: None   Collection Time: 04/28/14  1:07 PM  Result Value Ref Range   ABO/RH(D) O POS    Antibody Screen NEG    Sample Expiration 05/01/2014    Dg Shoulder Left  04/28/2014   CLINICAL DATA:  Recurrent left shoulder prosthesis dislocation  EXAM: LEFT SHOULDER - 2+ VIEW  COMPARISON:  04/19/2014, 03/30/2014  FINDINGS: There is recurrent anterior dislocation of the left humeral prosthesis component with respect to the glenoid component. No fracture line is identified.  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   Electronically Signed   By: Conchita Paris M.D.   On: 04/28/2014 12:28    ROS  Blood pressure 120/58, pulse 77, temperature 97.7 F (36.5 C), temperature source Oral, resp. rate 18, SpO2 98 %. Physical Exam  Patient is reclined on the ED stretcher with the left shoulder supported with pillows and blankets. Unable to move the left shoulder due to pain.  Apparent anterior superior dislocation of the prosthesis., NVI distally. Assessment/Plan Recurrent left shoulder reverse replacement.  Will need to take the patient to surgery and due a poly exchange to tighten the shoulder up.  The patient and her husband understand and agree with the plan.  Sylva Overley,STEVEN R 04/28/2014, 2:10 PM

## 2014-04-29 DIAGNOSIS — T84028A Dislocation of other internal joint prosthesis, initial encounter: Secondary | ICD-10-CM | POA: Diagnosis not present

## 2014-04-29 LAB — HEMOGLOBIN AND HEMATOCRIT, BLOOD
HEMATOCRIT: 35.3 % — AB (ref 36.0–46.0)
Hemoglobin: 10.8 g/dL — ABNORMAL LOW (ref 12.0–15.0)

## 2014-04-29 LAB — BASIC METABOLIC PANEL
ANION GAP: 12 (ref 5–15)
BUN: 7 mg/dL (ref 6–23)
CALCIUM: 8.3 mg/dL — AB (ref 8.4–10.5)
CO2: 24 meq/L (ref 19–32)
CREATININE: 0.61 mg/dL (ref 0.50–1.10)
Chloride: 105 mEq/L (ref 96–112)
GFR calc Af Amer: >90 mL/min (ref >90)
GFR calc non Af Amer: 88 mL/min — ABNORMAL LOW (ref >90)
GLUCOSE: 137 mg/dL — AB (ref 70–99)
Potassium: 4 mEq/L (ref 3.7–5.3)
Sodium: 141 mEq/L (ref 137–147)

## 2014-04-29 LAB — GLUCOSE, CAPILLARY: Glucose-Capillary: 127 mg/dL — ABNORMAL HIGH (ref 70–99)

## 2014-04-29 MED ORDER — CETYLPYRIDINIUM CHLORIDE 0.05 % MT LIQD: 7.0000 mL | Freq: Two times a day (BID) | OROMUCOSAL | Status: DC

## 2014-04-29 MED ORDER — HYDROCODONE-ACETAMINOPHEN 5-325 MG PO TABS: 1.0000 | ORAL_TABLET | ORAL | Status: DC | PRN

## 2014-04-29 NOTE — Progress Notes (Signed)
Orthopedics Progress Note  Subjective: Left shoulder feels better this morning  Objective:  Filed Vitals:   04/29/14 0657  BP: 137/75  Pulse: 77  Temp: 98.1 F (36.7 C)  Resp: 18    General: Awake and alert  Musculoskeletal: left shoulder dressing intact, min pain with AROM Neurovascularly intact  Lab Results  Component Value Date   WBC 5.0 04/28/2014   HGB 10.8* 04/29/2014   HCT 35.3* 04/29/2014   MCV 88.4 04/28/2014   PLT 178 04/28/2014       Component Value Date/Time   NA 141 04/29/2014 0454   K 4.0 04/29/2014 0454   CL 105 04/29/2014 0454   CO2 24 04/29/2014 0454   GLUCOSE 137* 04/29/2014 0454   BUN 7 04/29/2014 0454   CREATININE 0.61 04/29/2014 0454   CALCIUM 8.3* 04/29/2014 0454   GFRNONAA 88* 04/29/2014 0454   GFRAA >90 04/29/2014 0454    Lab Results  Component Value Date   INR 1.00 04/28/2014   INR 0.90 04/28/2012    Assessment/Plan: POD #1 s/p Procedure(s): IRRIGATION AND DEBRIDEMENT SHOULDER WITH POLY EXCHANGE Stable this morning for D/C home Follow up in two weeks  Shelia Balls. Ranell Patrick, MD 04/29/2014 9:46 AM

## 2014-04-29 NOTE — Progress Notes (Signed)
Pt d/c'd home. Discharge instructions given and she verbalized understanding. Pt was given AVS and prescription for norco. Pt has no complaints.Pt was wheeled down by wheelchair and will be taken home by private passenger vehicle.  MCCLAIN, Charly Hunton L 04/29/2014  11:17 AM

## 2014-04-29 NOTE — Progress Notes (Signed)
UR completed 

## 2014-04-29 NOTE — Evaluation (Addendum)
Occupational Therapy Evaluation Patient Details Name: Shelia Barber MRN: [id number] DOB: 1940-09-05 Today's Date: 04/29/2014    History of Present Illness 73 y.o. s/p IRRIGATION AND DEBRIDEMENT SHOULDER WITH POLY EXCHANGE. Pt had previous Lt shoulder surgery approximately 1 month ago.   Clinical Impression   Pt s/p above. Education provided to pt and her friend. Pt waiting to get discharged. Pt does not want HHOT and has her friend to assist her upon d/c.    Follow Up Recommendations  Supervision/Assistance - 24 hour;No OT follow up    Equipment Recommendations  None recommended by OT    Recommendations for Other Services       Precautions / Restrictions Precautions Precautions: Shoulder Type of Shoulder Precautions: ADLs, sling education, AROM elbow, wrist, hand, Active/Assist shoulder ROM within limits of pain, no pushing, pulling, lifting with LUE (can use to hold light objects). Shoulder Interventions: Shoulder sling/immobilizer;For comfort Precaution Booklet Issued: Yes (comment) Precaution Comments: educated on precautions Required Braces or Orthoses: Sling (for comfort-when out) Restrictions Weight Bearing Restrictions: Yes LUE Weight Bearing: Non weight bearing      Mobility Bed Mobility                  Transfers Overall transfer level: Needs assistance   Transfers: Sit to/from Stand Sit to Stand: Min guard (stand to sit transfer to chair)              Balance                                            ADL Overall ADL's : Needs assistance/impaired     Grooming: Oral care;Wash/dry face;Brushing hair;Standing       Lower Body Bathing: Moderate assistance;Sit to/from stand       Lower Body Dressing: Moderate assistance;Sit to/from stand   Toilet Transfer: Min guard;Ambulation (chair)           Functional mobility during ADLs: Min guard General ADL Comments: Encouraged pt to use LUE for functional activities.  Tried using left arm for grooming at sink and talked about using right hand to help assist LUE. Discussed positioning of LUE. Suggested having chair behind pt for pendulums. Reviewed ADL information.  Verified with Dr. Ranell Patrick that pt is allowed to perform AROM of left shoulder.     Vision                     Perception     Praxis      Pertinent Vitals/Pain Pain Assessment: 0-10 Pain Score:  (14 in back and 9 in left shoulder) Pain Location: back and left shoulder Pain Intervention(s): Monitored during session     Hand Dominance Right   Extremity/Trunk Assessment Upper Extremity Assessment Upper Extremity Assessment: LUE deficits/detail LUE Deficits / Details: s/p I & D with poly exchange   Lower Extremity Assessment Lower Extremity Assessment: Overall WFL for tasks assessed       Communication Communication Communication: No difficulties   Cognition Arousal/Alertness: Awake/alert Behavior During Therapy: WFL for tasks assessed/performed Overall Cognitive Status: Within Functional Limits for tasks assessed                     General Comments       Exercises Exercises: Other exercises;Shoulder Other Exercises Other Exercises: approximately 10 reps each of left AAROM elbow fleixon/extension, AROM left digit composite  flexion/extension, AROM wrist flexion/extension, and AROM supination/pronation.  explained importance of elbow, wrist, hand ROM Other Exercises: Performed approximately 10 reps of each pendulum; educated on alternative technique for pendulums in sitting position due to pt's back pain. Other Exercises: Talked about moving neck around to help with stiffness.   Shoulder Instructions Shoulder Instructions Donning/doffing shirt without moving shoulder:  (pt able to verbalize (pt also allowed to move left shoulder)) Method for sponge bathing under operated UE:  (educated) Donning/doffing sling/immobilizer: Minimal assistance Correct positioning  of sling/immobilizer: Minimal assistance Pendulum exercises (written home exercise program): Minimal assistance ROM for elbow, wrist and digits of operated UE: Minimal assistance Sling wearing schedule (on at all times/off for ADL's):  (discussed for comfort and pt said when out per Dr. Ranell Patrick) Positioning of UE while sleeping: Patient able to independently direct caregiver    Home Living Family/patient expects to be discharged to:: Private residence Living Arrangements: Non-relatives/Friends Available Help at Discharge: Friend(s);Available 24 hours/day Type of Home: House Home Access: Stairs to enter [employer] of Steps: 3 Entrance Stairs-Rails: Right;Left       Bathroom Shower/Tub: Producer, television/film/video: Standard     Home Equipment: Shower seat;Bedside commode          Prior Functioning/Environment Level of Independence: Needs assistance    ADL's / Homemaking Assistance Needed: assist with LB dressing        OT Diagnosis: Acute pain   OT Problem List:     OT Treatment/Interventions:      OT Goals(Current goals can be found in the care plan section)    OT Frequency:     Barriers to D/C:            Co-evaluation              End of Session Equipment Utilized During Treatment: Gait belt (sling)  Activity Tolerance: Patient limited by pain Patient left: in chair;with family/visitor present   Time: 4503-8882  OT Time Calculation (min): 25 min Charges:  OT General Charges $OT Visit: 1 Procedure OT Evaluation $Initial OT Evaluation Tier I: 1 Procedure OT Treatments $Therapeutic Exercise: 8-22 mins G-Codes: OT G-codes **NOT FOR INPATIENT CLASS** Functional Assessment Tool Used: clinical judgment Functional Limitation: Self care Self Care Current Status (C0034): At least 20 percent but less than 40 percent impaired, limited or restricted Self Care Goal Status (J1791): At least 20 percent but less than 40 percent impaired,  limited or restricted Self Care Discharge Status [phone]): At least 20 percent but less than 40 percent impaired, limited or restricted  Earlie Raveling OTR/L [phone] 04/29/2014, 11:06 AM

## 2014-04-29 NOTE — Op Note (Signed)
Shelia Barber, Shelia Barber NO.:  [id number]  MEDICAL RECORD NO.:  [id number]  LOCATION:  [id number]                        FACILITY:  [facility]  PHYSICIAN:  Almedia Balls. Ranell Patrick, M.D. DATE OF BIRTH:  05-Mar-1941  DATE OF PROCEDURE:  04/28/2014 DATE OF DISCHARGE:  04/29/2014                              OPERATIVE REPORT   PREOPERATIVE DIAGNOSIS:  Left dislocated reverse total shoulder, recurrent.  POSTOPERATIVE DIAGNOSIS:  Left dislocated reverse total shoulder, recurrent.  PROCEDURE PERFORMED:  Open I and D of left shoulder with polyethylene exchange and reduction of dislocated reverse total shoulder replacement.  ATTENDING SURGEON:  Almedia Balls. Ranell Patrick, MD  ASSISTANT:  Donnie Coffin. Dixon, PA-C, who has scrubbed the entire procedure and necessary for satisfactory completion of surgery.  ANESTHESIA:  General anesthesia was used.  ESTIMATED BLOOD LOSS:  Less than 50 mL.  FLUID REPLACEMENT:  1000 mL of crystalloid.  INSTRUMENT COUNTS:  Correct.  COMPLICATIONS:  There were no complications.  ANTIBIOTICS:  Perioperative antibiotics were given.  INDICATIONS:  The patient is a 73 year old female, who is 4 weeks out from a left reverse total shoulder arthroplasty.  She had her second dislocation this morning while doing a minimal task at home.  Due to the recurrent instability in her shoulder, I advised the patient she would require an open I and D of the shoulder and polyethylene exchange to tighten up her left shoulder placement.  The patient understands risks and benefits of procedure, would like to proceed.  Informed consent was obtained.  DESCRIPTION OF PROCEDURE:  After an adequate level of anesthesia was achieved, the patient was positioned in the modified beach-chair position.  Left shoulder correctly identified, sterilely prepped and draped in usual manner.  Time-out was called.  The patient's prior incision was utilized.  A 10 blade scalpel was used to dissect  down through subcutaneous tissues.  We identified some suture material, which was removed.  We entered the deep plane beneath the deltopectoral interval and identified.  The shoulder was reduced just through the prepping process.  We were able to dislocate the shoulder without too much difficulty and felt that it was just too loose.  We did not identify any impinging lesions.  We went ahead and freed up the tissue enough to get the humeral stem out, but we could retrieve the 38+ 6 polyethylene out and then we placed a +9 spacer and then a 38+ 3 polyethylene and impacted that in position, reduced the shoulder, had very nice tight pop and snap and almost could not re-dislocate, the shoulder was extremely tight and felt like this would be pony tied on her, negative sulcus, negative gapping, had a nice tight conjoined tendon.  We thoroughly irrigated the shoulder.  I found no reason for this dislocation other than just relaxing of the tissues, definitely no impinging lesions.  The rotation and alignment on the implant was in good position.  We then irrigated and then closed with #0 PDS for the deltopectoral interval and then a 2-0 Vicryl subcutaneous closure and staples for skin.  Sterile dressing was applied.  The patient was transported to the recovery room in a stable condition.  Almedia Balls. Ranell Patrick, M.D.     SRN/MEDQ  D:  04/28/2014  T:  04/29/2014  Job:  [id number]

## 2014-04-29 NOTE — Plan of Care (Signed)
Problem: Phase I Progression Outcomes Goal: Pain controlled with appropriate interventions Outcome: Completed/Met Date Met:  04/29/14     

## 2014-04-29 NOTE — Discharge Summary (Signed)
Physician Discharge Summary   Patient ID: Shelia Barber MRN: [id number] DOB/AGE: 1940-06-25 73 y.o.  Admit date: 04/28/2014 Discharge date: 04/29/2014  Admission Diagnoses:  Active Problems:   Shoulder dislocation, recurrent   Discharge Diagnoses:  Same   Surgeries: Procedure(s): IRRIGATION AND DEBRIDEMENT SHOULDER WITH POLY EXCHANGE on 04/28/2014   Consultants: OT  Discharged Condition: Stable  Hospital Course: Shelia Barber is an 73 y.o. female who was admitted 04/28/2014 with a chief complaint of  Chief Complaint  Patient presents with  . Shoulder Injury  , and found to have a diagnosis of left shoulder dislocation(reverse TSA).  They were brought to the operating room on 04/28/2014 and underwent the above named procedures.    The patient had an uncomplicated hospital course and was stable for discharge.  Recent vital signs:  Filed Vitals:   04/29/14 0657  BP: 137/75  Pulse: 77  Temp: 98.1 F (36.7 C)  Resp: 18    Recent laboratory studies:  Results for orders placed or performed during the hospital encounter of 04/28/14  CBC with Differential  Result Value Ref Range   WBC 5.0 4.0 - 10.5 K/uL   RBC 4.13 3.87 - 5.11 MIL/uL   Hemoglobin 11.4 (L) 12.0 - 15.0 g/dL   HCT 87.6 81.1 - 57.2 %   MCV 88.4 78.0 - 100.0 fL   MCH 27.6 26.0 - 34.0 pg   MCHC 31.2 30.0 - 36.0 g/dL   RDW 62.0 (H) 35.5 - 97.4 %   Platelets 178 150 - 400 K/uL   Neutrophils Relative % 49 43 - 77 %   Neutro Abs 2.4 1.7 - 7.7 K/uL   Lymphocytes Relative 36 12 - 46 %   Lymphs Abs 1.8 0.7 - 4.0 K/uL   Monocytes Relative 8 3 - 12 %   Monocytes Absolute 0.4 0.1 - 1.0 K/uL   Eosinophils Relative 7 (H) 0 - 5 %   Eosinophils Absolute 0.4 0.0 - 0.7 K/uL   Basophils Relative 0 0 - 1 %   Basophils Absolute 0.0 0.0 - 0.1 K/uL  Basic metabolic panel  Result Value Ref Range   Sodium 141 137 - 147 mEq/L   Potassium 3.8 3.7 - 5.3 mEq/L   Chloride 101 96 - 112 mEq/L   CO2 26 19 - 32 mEq/L   Glucose,  Bld 82 70 - 99 mg/dL   BUN 8 6 - 23 mg/dL   Creatinine, Ser 1.63 0.50 - 1.10 mg/dL   Calcium 9.1 8.4 - 84.5 mg/dL   GFR calc non Af Amer 89 (L) >90 mL/min   GFR calc Af Amer >90 >90 mL/min   Anion gap 14 5 - 15  APTT  Result Value Ref Range   aPTT 30 24 - 37 seconds  Protime-INR  Result Value Ref Range   Prothrombin Time 13.3 11.6 - 15.2 seconds   INR 1.00 0.00 - 1.49  Glucose, capillary  Result Value Ref Range   Glucose-Capillary 82 70 - 99 mg/dL   Comment 1 Notify RN   Hemoglobin and hematocrit, blood  Result Value Ref Range   Hemoglobin 10.8 (L) 12.0 - 15.0 g/dL   HCT 36.4 (L) 68.0 - 32.1 %  Basic metabolic panel  Result Value Ref Range   Sodium 141 137 - 147 mEq/L   Potassium 4.0 3.7 - 5.3 mEq/L   Chloride 105 96 - 112 mEq/L   CO2 24 19 - 32 mEq/L   Glucose, Bld 137 (H) 70 - 99 mg/dL  BUN 7 6 - 23 mg/dL   Creatinine, Ser 9.62 0.50 - 1.10 mg/dL   Calcium 8.3 (L) 8.4 - 10.5 mg/dL   GFR calc non Af Amer 88 (L) >90 mL/min   GFR calc Af Amer >90 >90 mL/min   Anion gap 12 5 - 15  Glucose, capillary  Result Value Ref Range   Glucose-Capillary 103 (H) 70 - 99 mg/dL  Glucose, capillary  Result Value Ref Range   Glucose-Capillary 127 (H) 70 - 99 mg/dL  Type and screen  Result Value Ref Range   ABO/RH(D) O POS    Antibody Screen NEG    Sample Expiration 05/01/2014   ABO/Rh  Result Value Ref Range   ABO/RH(D) O POS     Discharge Medications:     Medication List    TAKE these medications        atorvastatin 20 MG tablet  Commonly known as:  LIPITOR  Take 20 mg by mouth daily.     cetirizine 10 MG tablet  Commonly known as:  ZYRTEC  Take 10 mg by mouth daily.     clopidogrel 75 MG tablet  Commonly known as:  PLAVIX  Take 75 mg by mouth daily.     gabapentin 300 MG capsule  Commonly known as:  NEURONTIN  Take 300 mg by mouth 4 (four) times daily.     glimepiride 2 MG tablet  Commonly known as:  AMARYL  Take 2 mg by mouth 2 (two) times daily.      HYDROcodone-acetaminophen 5-325 MG per tablet  Commonly known as:  NORCO  Take 1 tablet by mouth every 6 (six) hours as needed for moderate pain.     HYDROcodone-acetaminophen 5-325 MG per tablet  Commonly known as:  NORCO  Take 1-2 tablets by mouth every 4 (four) hours as needed for moderate pain.     hydroxychloroquine 200 MG tablet  Commonly known as:  PLAQUENIL  Take 200 mg by mouth 2 (two) times daily.     inFLIXimab 100 MG injection  Commonly known as:  REMICADE  Inject 100 mg into the vein every 8 (eight) weeks.     lansoprazole 15 MG capsule  Commonly known as:  PREVACID  Take 15 mg by mouth 3 (three) times a week. Every Monday, Wednesday, friday     loratadine 10 MG tablet  Commonly known as:  CLARITIN  Take 10 mg by mouth daily.     losartan 25 MG tablet  Commonly known as:  COZAAR  Take 25 mg by mouth daily.     metFORMIN 1000 MG tablet  Commonly known as:  GLUCOPHAGE  Take 1,000 mg by mouth 2 (two) times daily with a meal.     methocarbamol 500 MG tablet  Commonly known as:  ROBAXIN  Take 500 mg by mouth 2 (two) times daily.     PROBIOTIC DAILY PO  Take 1 capsule by mouth daily. Equate Brand Probiotic     ranitidine 150 MG tablet  Commonly known as:  ZANTAC  Take 150 mg by mouth daily.        Diagnostic Studies: Dg Shoulder Left  04/28/2014   CLINICAL DATA:  Tightening of the left shoulder, with concern for left shoulder dislocation. Subsequent encounter.  EXAM: LEFT SHOULDER - 2+ VIEW  COMPARISON:  Left shoulder radiographs performed earlier today at 11:27 a.m.  FINDINGS: The location of the prosthesis is not well assessed without a scapular Y-view. It is grossly unremarkable on the frontal  view. Overlying postoperative change is seen no fractures are identified. The prosthesis appears grossly intact, without evidence of loosening. Mild degenerative change is noted at the left acromioclavicular joint.  IMPRESSION: Location of the prosthesis is not well  assessed without a scapular Y-view. It is grossly unremarkable in appearance on the frontal view.   Electronically Signed   By: Roanna Raider M.D.   On: 04/28/2014 22:56   Dg Shoulder Left  04/28/2014   CLINICAL DATA:  Recurrent left shoulder prosthesis dislocation  EXAM: LEFT SHOULDER - 2+ VIEW  COMPARISON:  04/19/2014, 03/30/2014  FINDINGS: There is recurrent anterior dislocation of the left humeral prosthesis component with respect to the glenoid component. No fracture line is identified.  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   Electronically Signed   By: Christiana Pellant M.D.   On: 04/28/2014 12:28   Dg Shoulder Left  04/19/2014   CLINICAL DATA:  Left shoulder surgery on Nov 6; woke up this morning and felt stiffer than usual, she reports sitting on couch reading the paper and heard a pop. Felt a movement in her shoulder and then pain. She applied the sling she had from surgery to immobilize it. She states pain with any movement. Positive radial pulse, hand warm, sensation intact. Will not allow arm to be moved at this time. Axillary was not obtained due to pt condition.  EXAM: LEFT SHOULDER - 2+ VIEW  COMPARISON:  03/30/2014  FINDINGS: Positioning for imaging is nonstandard. There is dislocation of the shoulder arthroplasty. No acute fracture. Degenerative changes are noted at the acromioclavicular joint.  IMPRESSION: Dislocation of the shoulder arthroplasty.   Electronically Signed   By: Rosalie Gums M.D.   On: 04/19/2014 10:50   Dg Shoulder Left  03/30/2014   CLINICAL DATA:  Postop reverse total shoulder arthroplasty  EXAM: LEFT SHOULDER - 2+ VIEW  COMPARISON:  None.  FINDINGS: Left shoulder replacement in satisfactory position alignment. No fracture or complication.  IMPRESSION: Satisfactory left shoulder replacement.  These results will be called to the ordering clinician or representative by the Radiologist Assistant, and communication documented in the PACS or  zVision Dashboard.   Electronically Signed   By: Marlan Palau M.D.   On: 03/30/2014 11:43   Dg Shoulder Left Port  04/19/2014   CLINICAL DATA:  73 year old female with history of severely subluxed left shoulder (with left shoulder arthroplasty).  EXAM: LEFT SHOULDER - 1 VIEW  COMPARISON:  04/19/2014.  FINDINGS: Postreduction film demonstrates apparent normal articulation between the glenoid and proximal humeral prosthesis. No periprosthetic fracture is noted.  IMPRESSION: 1. Successful relocation of the left shoulder in this patient status post left shoulder arthroplasty.   Electronically Signed   By: Trudie Reed M.D.   On: 04/19/2014 15:30   Dg Shoulder Left Port  04/19/2014   CLINICAL DATA:  Post reduction attempt  EXAM: LEFT SHOULDER - 1 VIEW  COMPARISON:  Study obtained earlier in the day  FINDINGS: The apparent subluxation at the level of the left total shoulder arthroplasty is again noted. On this single view, it is difficult to ascertain whether there has been an insignificant change in the overall alignment of the humeral component with respect to the glenoid component. No fracture is seen.  IMPRESSION: On this single view, there appears to remain subluxation if not frank dislocation at the total shoulder replacement prosthesis. No fracture.   Electronically Signed   By: Bretta Bang M.D.   On: 04/19/2014 13:56  Disposition: 01-Home or Self Care        Follow-up Information    Follow up with Kreg Earhart,STEVEN R, MD. Call in 2 weeks.   Specialty:  Orthopedic Surgery   Why:  [phone]   Contact information:   [address] [place] [address] [phone]        Signed: Verlee Rossetti 04/29/2014, 9:49 AM

## 2014-04-30 ENCOUNTER — Encounter (HOSPITAL_COMMUNITY): Payer: Self-pay | Admitting: Orthopedic Surgery

## 2014-04-30 LAB — GLUCOSE, CAPILLARY: Glucose-Capillary: 62 mg/dL — ABNORMAL LOW (ref 70–99)

## 2014-05-03 ENCOUNTER — Encounter (HOSPITAL_COMMUNITY): Payer: Self-pay | Admitting: Cardiovascular Disease

## 2014-07-12 ENCOUNTER — Other Ambulatory Visit (HOSPITAL_COMMUNITY): Payer: Self-pay | Admitting: Orthopedic Surgery

## 2014-07-12 DIAGNOSIS — T8484XA Pain due to internal orthopedic prosthetic devices, implants and grafts, initial encounter: Secondary | ICD-10-CM

## 2014-07-12 DIAGNOSIS — Z96659 Presence of unspecified artificial knee joint: Principal | ICD-10-CM

## 2014-07-24 ENCOUNTER — Encounter (HOSPITAL_COMMUNITY)
Admission: RE | Admit: 2014-07-24 | Discharge: 2014-07-24 | Disposition: A | Discharge status: Home / Self Care | Payer: Medicare Other | Source: Ambulatory Visit | Attending: Orthopedic Surgery | Admitting: Orthopedic Surgery

## 2014-07-24 DIAGNOSIS — T8484XA Pain due to internal orthopedic prosthetic devices, implants and grafts, initial encounter: Secondary | ICD-10-CM

## 2014-07-24 DIAGNOSIS — Z96652 Presence of left artificial knee joint: Secondary | ICD-10-CM | POA: Diagnosis not present

## 2014-07-24 DIAGNOSIS — Z96659 Presence of unspecified artificial knee joint: Secondary | ICD-10-CM

## 2014-07-24 DIAGNOSIS — M25562 Pain in left knee: Secondary | ICD-10-CM | POA: Diagnosis present

## 2014-07-24 DIAGNOSIS — M25462 Effusion, left knee: Secondary | ICD-10-CM | POA: Insufficient documentation

## 2014-07-24 MED ORDER — TECHNETIUM TC 99M MEDRONATE IV KIT
25.0000 | PACK | Freq: Once | INTRAVENOUS | Status: AC | PRN
Start: 2014-07-24 — End: 2014-07-24
  Administered 2014-07-24: 25 via INTRAVENOUS

## 2014-07-29 ENCOUNTER — Encounter (HOSPITAL_COMMUNITY): Payer: Self-pay | Admitting: *Deleted

## 2014-07-29 ENCOUNTER — Emergency Department (INDEPENDENT_AMBULATORY_CARE_PROVIDER_SITE_OTHER)
Admission: EM | Admit: 2014-07-29 | Discharge: 2014-07-29 | Disposition: A | Discharge status: Home / Self Care | Payer: Medicare Other | Source: Home / Self Care | Attending: Family Medicine | Admitting: Family Medicine

## 2014-07-29 DIAGNOSIS — T162XXA Foreign body in left ear, initial encounter: Secondary | ICD-10-CM

## 2014-07-29 NOTE — ED Provider Notes (Signed)
CSN: [id number]     Arrival date & time 07/29/14  6010 History   First MD Initiated Contact with Patient 07/29/14 [phone]     Chief Complaint  Patient presents with  . Foreign Body in Ear   (Consider location/radiation/quality/duration/timing/severity/associated sxs/prior Treatment) Patient is a 74 y.o. female presenting with foreign body in ear. The history is provided by the patient.  Foreign Body in Ear This is a new problem. The current episode started 1 to 2 hours ago (q tip broke off in left ear, husband suctioned with vacuum cleaner hose, concerned that still present.). The problem has not changed since onset.   Past Medical History  Diagnosis Date  . Gastritis   . Sleep apnea   . Osteoporosis   . Neuropathy   . Diverticulosis   . Hyperlipidemia     takes Atorvastatin daily  . Diabetes mellitus without complication     takes Amaryl daily  . Osteoarthritis     takes Plaquenil daily  . RA (rheumatoid arthritis)     Remicade every 8wks   . Hypertension     takes Losartan daily  . Seasonal allergies     takes Claritin daily  . GERD (gastroesophageal reflux disease)     takes Prevacid and Zantac daily  . History of colon polyps   . History of neck surgery 1993  . Cyst of kidney, acquired     right  . Peripheral neuropathy   . TIA (transient ischemic attack)     x 3 and takes Plavix daily  . Migraines     frequent headaches recently but not migraines;last migraine a few months ago  . Joint pain   . Joint swelling   . Chronic back pain     DDD,scoliosis  . H/O hiatal hernia   . Urinary frequency   . Urinary urgency    Past Surgical History  Procedure Laterality Date  . Cesarean section  1963/1966  . Cardiac catheterization  04/29/2012    30% proximal LAD lesion, otherwise normal coronaries; LVEF 55% and no WMAs   . Tubal ligation  1966  . Appendectomy  1963  . Foot surgery Right 1968  . Colonoscopy    . Spurs removed from feet Bilateral 1994  . Abdominal  hysterectomy  1994  . Carpal tunnel release Bilateral 1995  . Cholecystectomy  2002  . Cyst removed from kidney Right 2002  . Cystoscopy  2002  . Cataract surgery Bilateral 2010  . Knee surgery Left 2010    d/t torn cartliage  . Joint replacement  2010    left knee  . Total shoulder arthroplasty Left 03/30/2014    Procedure: LEFT TOTAL REVERSE SHOULDER ARTHROPLASTY;  Surgeon: Verlee Rossetti, MD;  Location: [facility] OR;  Service: Orthopedics;  Laterality: Left;  . Reverse shoulder arthroplasty Left 04/28/2014    Procedure: IRRIGATION AND DEBRIDEMENT SHOULDER WITH POLY EXCHANGE;  Surgeon: Verlee Rossetti, MD;  Location: [facility] OR;  Service: Orthopedics;  Laterality: Left;  . Left heart catheterization with coronary angiogram N/A 04/29/2012    Procedure: LEFT HEART CATHETERIZATION WITH CORONARY ANGIOGRAM;  Surgeon: Wendall Stade, MD;  Location: [facility] CATH LAB;  Service: Cardiovascular;  Laterality: N/A;   History reviewed. No pertinent family history. History  Substance Use Topics  . Smoking status: Never Smoker   . Smokeless tobacco: Not on file  . Alcohol Use: No   OB History    No data available     Review of Systems  Constitutional: Negative.   HENT: Positive for ear pain. Negative for ear discharge.     Allergies  Ivp dye; Aspirin; and Imodium  Home Medications   Prior to Admission medications   Medication Sig Start Date End Date Taking? Authorizing Provider  atorvastatin (LIPITOR) 20 MG tablet Take 20 mg by mouth daily.    Historical Provider, MD  cetirizine (ZYRTEC) 10 MG tablet Take 10 mg by mouth daily.    Historical Provider, MD  clopidogrel (PLAVIX) 75 MG tablet Take 75 mg by mouth daily.    Historical Provider, MD  gabapentin (NEURONTIN) 300 MG capsule Take 300 mg by mouth 4 (four) times daily.  04/26/12   Historical Provider, MD  glimepiride (AMARYL) 2 MG tablet Take 2 mg by mouth 2 (two) times daily.    Historical Provider, MD  HYDROcodone-acetaminophen (NORCO) 5-325 MG per  tablet Take 1 tablet by mouth every 6 (six) hours as needed for moderate pain. 03/30/14   Verlee Rossetti, MD  HYDROcodone-acetaminophen (NORCO) 5-325 MG per tablet Take 1-2 tablets by mouth every 4 (four) hours as needed for moderate pain. 04/29/14   Verlee Rossetti, MD  hydroxychloroquine (PLAQUENIL) 200 MG tablet Take 200 mg by mouth 2 (two) times daily.    Historical Provider, MD  inFLIXimab (REMICADE) 100 MG injection Inject 100 mg into the vein every 8 (eight) weeks.    Historical Provider, MD  lansoprazole (PREVACID) 15 MG capsule Take 15 mg by mouth 3 (three) times a week. Every Monday, Wednesday, friday    Historical Provider, MD  loratadine (CLARITIN) 10 MG tablet Take 10 mg by mouth daily.    Historical Provider, MD  losartan (COZAAR) 25 MG tablet Take 25 mg by mouth daily.    Historical Provider, MD  metFORMIN (GLUCOPHAGE) 1000 MG tablet Take 1,000 mg by mouth 2 (two) times daily with a meal.    Historical Provider, MD  methocarbamol (ROBAXIN) 500 MG tablet Take 500 mg by mouth 2 (two) times daily.    Historical Provider, MD  Probiotic Product (PROBIOTIC DAILY PO) Take 1 capsule by mouth daily. Equate Brand Probiotic    Historical Provider, MD  ranitidine (ZANTAC) 150 MG tablet Take 150 mg by mouth daily.    Historical Provider, MD   BP 128/92 mmHg  Pulse 114  Temp(Src) 98.1 F (36.7 C) (Oral)  Resp 20  SpO2 95% Physical Exam  Constitutional: She is oriented to person, place, and time. She appears well-developed.  HENT:  Head: Normocephalic.  Right Ear: External ear normal.  Left Ear: External ear normal.  Mouth/Throat: Oropharynx is clear and moist.  No fb seen.  Eyes: Pupils are equal, round, and reactive to light.  Neck: Normal range of motion. Neck supple.  Neurological: She is alert and oriented to person, place, and time.  Skin: Skin is warm and dry.  Nursing note and vitals reviewed.   ED Course  Procedures (including critical care time) Labs Review Labs Reviewed  - No data to display  Imaging Review No results found.   MDM   1. Foreign body in left ear, initial encounter    Ear irrigated but no fb    Linna Hoff, MD 07/29/14 1942

## 2014-07-29 NOTE — ED Notes (Signed)
Pt  Reports  She  Was  Cleaning  Her  l  Ear  With  A  q  Tip     And  She  States  The  cottn  Is  Still  In  Her    Ear          The   Pt  Is  Awake  Alert  And  Oriented  She  denys  Any  Bleeding         At  This  Time

## 2014-07-29 NOTE — Discharge Instructions (Signed)
Be careful with q-tips, no need to clean ears.

## 2015-03-25 ENCOUNTER — Emergency Department (HOSPITAL_COMMUNITY): Payer: Medicare Other

## 2015-03-25 ENCOUNTER — Encounter (HOSPITAL_COMMUNITY): Payer: Self-pay | Admitting: Emergency Medicine

## 2015-03-25 ENCOUNTER — Emergency Department (HOSPITAL_COMMUNITY)
Admission: EM | Admit: 2015-03-25 | Discharge: 2015-03-25 | Disposition: A | Discharge status: Home / Self Care | Payer: Medicare Other | Attending: Emergency Medicine | Admitting: Emergency Medicine

## 2015-03-25 DIAGNOSIS — Z7902 Long term (current) use of antithrombotics/antiplatelets: Secondary | ICD-10-CM | POA: Insufficient documentation

## 2015-03-25 DIAGNOSIS — E785 Hyperlipidemia, unspecified: Secondary | ICD-10-CM | POA: Diagnosis not present

## 2015-03-25 DIAGNOSIS — G629 Polyneuropathy, unspecified: Secondary | ICD-10-CM | POA: Insufficient documentation

## 2015-03-25 DIAGNOSIS — S4992XA Unspecified injury of left shoulder and upper arm, initial encounter: Secondary | ICD-10-CM | POA: Insufficient documentation

## 2015-03-25 DIAGNOSIS — Y9389 Activity, other specified: Secondary | ICD-10-CM | POA: Insufficient documentation

## 2015-03-25 DIAGNOSIS — Z9889 Other specified postprocedural states: Secondary | ICD-10-CM | POA: Diagnosis not present

## 2015-03-25 DIAGNOSIS — Y998 Other external cause status: Secondary | ICD-10-CM | POA: Diagnosis not present

## 2015-03-25 DIAGNOSIS — Y9289 Other specified places as the place of occurrence of the external cause: Secondary | ICD-10-CM | POA: Insufficient documentation

## 2015-03-25 DIAGNOSIS — K219 Gastro-esophageal reflux disease without esophagitis: Secondary | ICD-10-CM | POA: Insufficient documentation

## 2015-03-25 DIAGNOSIS — Z86010 Personal history of colonic polyps: Secondary | ICD-10-CM | POA: Diagnosis not present

## 2015-03-25 DIAGNOSIS — Z7984 Long term (current) use of oral hypoglycemic drugs: Secondary | ICD-10-CM | POA: Diagnosis not present

## 2015-03-25 DIAGNOSIS — G8929 Other chronic pain: Secondary | ICD-10-CM | POA: Diagnosis not present

## 2015-03-25 DIAGNOSIS — Z87448 Personal history of other diseases of urinary system: Secondary | ICD-10-CM | POA: Diagnosis not present

## 2015-03-25 DIAGNOSIS — G43909 Migraine, unspecified, not intractable, without status migrainosus: Secondary | ICD-10-CM | POA: Insufficient documentation

## 2015-03-25 DIAGNOSIS — Z79899 Other long term (current) drug therapy: Secondary | ICD-10-CM | POA: Insufficient documentation

## 2015-03-25 DIAGNOSIS — M1990 Unspecified osteoarthritis, unspecified site: Secondary | ICD-10-CM | POA: Diagnosis not present

## 2015-03-25 DIAGNOSIS — S80212A Abrasion, left knee, initial encounter: Secondary | ICD-10-CM | POA: Insufficient documentation

## 2015-03-25 DIAGNOSIS — S51002A Unspecified open wound of left elbow, initial encounter: Secondary | ICD-10-CM | POA: Diagnosis not present

## 2015-03-25 DIAGNOSIS — W01198A Fall on same level from slipping, tripping and stumbling with subsequent striking against other object, initial encounter: Secondary | ICD-10-CM | POA: Insufficient documentation

## 2015-03-25 DIAGNOSIS — E119 Type 2 diabetes mellitus without complications: Secondary | ICD-10-CM | POA: Diagnosis not present

## 2015-03-25 DIAGNOSIS — I10 Essential (primary) hypertension: Secondary | ICD-10-CM | POA: Insufficient documentation

## 2015-03-25 DIAGNOSIS — Z8673 Personal history of transient ischemic attack (TIA), and cerebral infarction without residual deficits: Secondary | ICD-10-CM | POA: Diagnosis not present

## 2015-03-25 DIAGNOSIS — M25512 Pain in left shoulder: Secondary | ICD-10-CM

## 2015-03-25 DIAGNOSIS — W101XXA Fall (on)(from) sidewalk curb, initial encounter: Secondary | ICD-10-CM

## 2015-03-25 MED ORDER — HYDROCODONE-ACETAMINOPHEN 5-325 MG PO TABS
1.0000 | ORAL_TABLET | Freq: Once | ORAL | Status: AC
  Administered 2015-03-25: 1 via ORAL
  Filled 2015-03-25: qty 1

## 2015-03-25 MED ORDER — ONDANSETRON 4 MG PO TBDP
4.0000 mg | ORAL_TABLET | Freq: Once | ORAL | Status: AC
  Administered 2015-03-25: 4 mg via ORAL
  Filled 2015-03-25: qty 1

## 2015-03-25 MED ORDER — HYDROCODONE-ACETAMINOPHEN 5-325 MG PO TABS: 1.0000 | ORAL_TABLET | Freq: Four times a day (QID) | ORAL | Status: DC | PRN

## 2015-03-25 NOTE — ED Notes (Signed)
Fell at [place] on concrete, injuring left shoulder, rates pain 10/10.  Had left shoulder surgery in Nov. of last year.

## 2015-03-25 NOTE — ED Notes (Signed)
Also c/o pain to left knee and left elbow.

## 2015-03-25 NOTE — ED Provider Notes (Signed)
CSN: [id number]     Arrival date & time 03/25/15  5621 History   First MD Initiated Contact with Patient 03/25/15 1205     Chief Complaint  Patient presents with  . Shoulder Injury    left  . Knee Injury    left  . Extremity Laceration    left     (Consider location/radiation/quality/duration/timing/severity/associated sxs/prior Treatment) HPI Comments: 74 year old female with extensive past medical history including hypertension, hyperlipidemia, type 2 diabetes, OSA, RA, GERD, peripheral neuropathy who presents with left shoulder pain. Just prior to arrival, the patient tripped on a cobblestone and wall walking into [employer] and fell onto her left side. She hit her left shoulder and her head. She did not lose consciousness. She reports severe, constant pain in her left shoulder and left upper arm as well as pain in her left elbow. She has mild pain in her left knee where she scraped it. She reports some neck pain which is worse with movement. No extremity numbness or weakness. No chest pain, hip pain, or abdominal pain. No visual changes. She was ambulatory after the event.  Patient is a 74 y.o. female presenting with shoulder injury. The history is provided by the patient.  Shoulder Injury    Past Medical History  Diagnosis Date  . Gastritis   . Sleep apnea   . Osteoporosis   . Neuropathy (HCC)   . Diverticulosis   . Hyperlipidemia     takes Atorvastatin daily  . Diabetes mellitus without complication (HCC)     takes Amaryl daily  . Osteoarthritis     takes Plaquenil daily  . RA (rheumatoid arthritis) (HCC)     Remicade every 8wks   . Hypertension     takes Losartan daily  . Seasonal allergies     takes Claritin daily  . GERD (gastroesophageal reflux disease)     takes Prevacid and Zantac daily  . History of colon polyps   . History of neck surgery 1993  . Cyst of kidney, acquired     right  . Peripheral neuropathy (HCC)   . TIA (transient ischemic attack)     x 3  and takes Plavix daily  . Migraines     frequent headaches recently but not migraines;last migraine a few months ago  . Joint pain   . Joint swelling   . Chronic back pain     DDD,scoliosis  . H/O hiatal hernia   . Urinary frequency   . Urinary urgency    Past Surgical History  Procedure Laterality Date  . Cesarean section  1963/1966  . Cardiac catheterization  04/29/2012    30% proximal LAD lesion, otherwise normal coronaries; LVEF 55% and no WMAs   . Tubal ligation  1966  . Appendectomy  1963  . Foot surgery Right 1968  . Colonoscopy    . Spurs removed from feet Bilateral 1994  . Abdominal hysterectomy  1994  . Carpal tunnel release Bilateral 1995  . Cholecystectomy  2002  . Cyst removed from kidney Right 2002  . Cystoscopy  2002  . Cataract surgery Bilateral 2010  . Knee surgery Left 2010    d/t torn cartliage  . Joint replacement  2010    left knee  . Total shoulder arthroplasty Left 03/30/2014    Procedure: LEFT TOTAL REVERSE SHOULDER ARTHROPLASTY;  Surgeon: Verlee Rossetti, MD;  Location: [facility] OR;  Service: Orthopedics;  Laterality: Left;  . Reverse shoulder arthroplasty Left 04/28/2014    Procedure: IRRIGATION  AND DEBRIDEMENT SHOULDER WITH POLY EXCHANGE;  Surgeon: Verlee Rossetti, MD;  Location: [facility] OR;  Service: Orthopedics;  Laterality: Left;  . Left heart catheterization with coronary angiogram N/A 04/29/2012    Procedure: LEFT HEART CATHETERIZATION WITH CORONARY ANGIOGRAM;  Surgeon: Wendall Stade, MD;  Location: [facility] CATH LAB;  Service: Cardiovascular;  Laterality: N/A;   History reviewed. No pertinent family history. Social History  Substance Use Topics  . Smoking status: Never Smoker   . Smokeless tobacco: None  . Alcohol Use: No   OB History    No data available     Review of Systems  10 Systems reviewed and are negative for acute change except as noted in the HPI.   Allergies  Ivp dye; Aspirin; and Imodium  Home Medications   Prior to Admission  medications   Medication Sig Start Date End Date Taking? Authorizing Provider  atorvastatin (LIPITOR) 20 MG tablet Take 20 mg by mouth daily.    Historical Provider, MD  cetirizine (ZYRTEC) 10 MG tablet Take 10 mg by mouth daily.    Historical Provider, MD  clopidogrel (PLAVIX) 75 MG tablet Take 75 mg by mouth daily.    Historical Provider, MD  gabapentin (NEURONTIN) 300 MG capsule Take 300 mg by mouth 4 (four) times daily.  04/26/12   Historical Provider, MD  glimepiride (AMARYL) 2 MG tablet Take 2 mg by mouth 2 (two) times daily.    Historical Provider, MD  HYDROcodone-acetaminophen (NORCO/VICODIN) 5-325 MG tablet Take 1 tablet by mouth every 6 (six) hours as needed. 03/25/15   Laurence Spates, MD  hydroxychloroquine (PLAQUENIL) 200 MG tablet Take 200 mg by mouth 2 (two) times daily.    Historical Provider, MD  inFLIXimab (REMICADE) 100 MG injection Inject 100 mg into the vein every 8 (eight) weeks.    Historical Provider, MD  lansoprazole (PREVACID) 15 MG capsule Take 15 mg by mouth 3 (three) times a week. Every Monday, Wednesday, friday    Historical Provider, MD  loratadine (CLARITIN) 10 MG tablet Take 10 mg by mouth daily.    Historical Provider, MD  losartan (COZAAR) 25 MG tablet Take 25 mg by mouth daily.    Historical Provider, MD  metFORMIN (GLUCOPHAGE) 1000 MG tablet Take 1,000 mg by mouth 2 (two) times daily with a meal.    Historical Provider, MD  methocarbamol (ROBAXIN) 500 MG tablet Take 500 mg by mouth 2 (two) times daily.    Historical Provider, MD  Probiotic Product (PROBIOTIC DAILY PO) Take 1 capsule by mouth daily. Equate Brand Probiotic    Historical Provider, MD  ranitidine (ZANTAC) 150 MG tablet Take 150 mg by mouth daily.    Historical Provider, MD   BP 146/79 mmHg  Pulse 76  Temp(Src) 97.9 F (36.6 C)  Resp 16  Ht 5\' 1"  (1.549 m)  Wt 233 lb (105.688 kg)  BMI 44.05 kg/m2  SpO2 98% Physical Exam  Constitutional: She is oriented to person, place, and time. She  appears well-developed and well-nourished. No distress.  Uncomfortable, holding L arm  HENT:  Head: Normocephalic and atraumatic.  Moist mucous membranes  Eyes: Conjunctivae and EOM are normal. Pupils are equal, round, and reactive to light.  Neck: No tracheal deviation present.  Cardiovascular: Normal rate, regular rhythm, normal heart sounds and intact distal pulses.   No murmur heard. Pulmonary/Chest: Effort normal and breath sounds normal. No stridor. She exhibits no tenderness.  Abdominal: Soft. Bowel sounds are normal. She exhibits no distension. There is  no tenderness.  Musculoskeletal: She exhibits no edema.  Unable to abduct R shoulder 2/2 pain; Tenderness to palpation of top of L shoulder without obvious deformity of shoulder or humerus; normal grip strength b/l hands, normal sensation x all 4 ext; no tenderness to palpation of L knee  Neurological: She is alert and oriented to person, place, and time. No cranial nerve deficit. She exhibits normal muscle tone.  Fluent speech  Skin: Skin is warm and dry.  Skin tear L elbow, abrasion L knee; 3cm partially ruptured hemorrhagic bulla on L posterior shoulder  Psychiatric: She has a normal mood and affect. Judgment normal.  Nursing note and vitals reviewed.   ED Course  Procedures (including critical care time) Labs Review Labs Reviewed - No data to display  Imaging Review Dg Elbow Complete Left  03/25/2015  CLINICAL DATA:  Fall on left shoulder EXAM: LEFT ELBOW - COMPLETE 3+ VIEW COMPARISON:  None. FINDINGS: Five views of left elbow submitted. No acute fracture or subluxation. No radiopaque foreign body. IMPRESSION: Negative. Electronically Signed   By: Natasha Mead M.D.   On: 03/25/2015 13:58   Ct Head Wo Contrast  03/25/2015  CLINICAL DATA:  Fall, hit left side head, left arm pain EXAM: CT HEAD WITHOUT CONTRAST CT CERVICAL SPINE WITHOUT CONTRAST TECHNIQUE: Multidetector CT imaging of the head and cervical spine was performed  following the standard protocol without intravenous contrast. Multiplanar CT image reconstructions of the cervical spine were also generated. COMPARISON:  08/10/2013 FINDINGS: CT HEAD FINDINGS No skull fracture is noted. Paranasal sinuses and mastoid air cells are unremarkable. No intracranial hemorrhage, mass effect or midline shift. Ventricular size is stable from prior exam. No acute cortical infarction. No mass lesion is noted on this unenhanced scan. CT CERVICAL SPINE FINDINGS Axial images of the cervical spine shows no acute fracture or subluxation. Computer processed images shows no acute fracture or subluxation. Degenerative changes C1-C2 articulation. There is bony fusion of C4-C5 vertebral bodies. Mild disc space flattening with anterior spurring at C5-C6 level. No prevertebral soft tissue swelling. Cervical airway is patent. Spinal canal is patent. There is no pneumothorax in visualized lung apices. Facet degenerative changes are noted at C5-C6 level. IMPRESSION: 1. No acute intracranial abnormality. 2. No cervical spine acute fracture or subluxation. Degenerative changes as described above. Electronically Signed   By: Natasha Mead M.D.   On: 03/25/2015 13:45   Ct Cervical Spine Wo Contrast  03/25/2015  CLINICAL DATA:  Fall, hit left side head, left arm pain EXAM: CT HEAD WITHOUT CONTRAST CT CERVICAL SPINE WITHOUT CONTRAST TECHNIQUE: Multidetector CT imaging of the head and cervical spine was performed following the standard protocol without intravenous contrast. Multiplanar CT image reconstructions of the cervical spine were also generated. COMPARISON:  08/10/2013 FINDINGS: CT HEAD FINDINGS No skull fracture is noted. Paranasal sinuses and mastoid air cells are unremarkable. No intracranial hemorrhage, mass effect or midline shift. Ventricular size is stable from prior exam. No acute cortical infarction. No mass lesion is noted on this unenhanced scan. CT CERVICAL SPINE FINDINGS Axial images of the  cervical spine shows no acute fracture or subluxation. Computer processed images shows no acute fracture or subluxation. Degenerative changes C1-C2 articulation. There is bony fusion of C4-C5 vertebral bodies. Mild disc space flattening with anterior spurring at C5-C6 level. No prevertebral soft tissue swelling. Cervical airway is patent. Spinal canal is patent. There is no pneumothorax in visualized lung apices. Facet degenerative changes are noted at C5-C6 level. IMPRESSION: 1. No acute  intracranial abnormality. 2. No cervical spine acute fracture or subluxation. Degenerative changes as described above. Electronically Signed   By: Natasha Mead M.D.   On: 03/25/2015 13:45   Dg Shoulder Left  03/25/2015  CLINICAL DATA:  74 year old female with left shoulder pain after tripping and falling at the [employer] earlier today. History of prior shoulder arthroplasty. EXAM: LEFT SHOULDER - 2+ VIEW COMPARISON:  Prior radiographs of the left shoulder 04/28/2014 FINDINGS: Surgical changes of prior left shoulder arthroplasty. No evidence of hardware complication or periprosthetic fracture. Alignment appears unchanged on these three views. The shoulder appears located on the axillary Y-view. Degenerative osteoarthritis of the acromioclavicular joint again noted. The visualized thorax is unremarkable. IMPRESSION: Surgical changes of the left shoulder arthroplasty without evidence of hardware complication, acute fracture or malalignment. Electronically Signed   By: Malachy Moan M.D.   On: 03/25/2015 12:36   Dg Humerus Left  03/25/2015  CLINICAL DATA:  Pain.  Difficulty moving arm. EXAM: LEFT HUMERUS - 2+ VIEW COMPARISON:  None. FINDINGS: Total left shoulder replacement. Hardware intact. No acute fracture noted. IMPRESSION: Total left shoulder replacement. No evidence of humeral fracture. Hardware intact . Electronically Signed   By: Maisie Fus  Register   On: 03/25/2015 14:01    EKG Interpretation None     Medications   HYDROcodone-acetaminophen (NORCO/VICODIN) 5-325 MG per tablet 1 tablet (1 tablet Oral Given 03/25/15 1311)  ondansetron (ZOFRAN-ODT) disintegrating tablet 4 mg (4 mg Oral Given 03/25/15 1311)    MDM   Final diagnoses:  Left shoulder pain  Fall involving sidewalk curb, initial encounter    74 year old female who presents with left shoulder, neck, and left knee pain after a fall from standing earlier today. Patient uncomfortable but in no acute distress at presentation. No obvious deformity of left shoulder but she has had multiple surgeries on her left shoulder. She had abrasions on her left elbow and knee but normal range of motion at these joints. She has a burn on her left posterior shoulder which she states is from sleeping on a heating pad one week ago. She has been following with her PCP and performing appropriate wound care. Obtained plain films of left shoulder, arm, and elbow as well as CT of head and neck. Gave the patient Lortab for pain.  Plain films and CT scans showed no acute injuries. Burn appears to be healing appropriately with no surrounding erythema to suggest infection. Instructed patient on supportive care and follow-up with PCP in one week if pain is not resolved. Return precautions reviewed and patient voiced understanding. Patient discharged in satisfactory condition.  Laurence Spates, MD 03/25/15 [phone]

## 2015-05-07 ENCOUNTER — Other Ambulatory Visit: Payer: Self-pay | Admitting: Orthopedic Surgery

## 2015-05-07 DIAGNOSIS — Z471 Aftercare following joint replacement surgery: Secondary | ICD-10-CM

## 2015-05-07 DIAGNOSIS — Z96612 Presence of left artificial shoulder joint: Principal | ICD-10-CM

## 2015-05-08 ENCOUNTER — Ambulatory Visit
Admission: RE | Admit: 2015-05-08 | Discharge: 2015-05-08 | Disposition: A | Discharge status: Home / Self Care | Payer: Medicare Other | Source: Ambulatory Visit | Attending: Orthopedic Surgery | Admitting: Orthopedic Surgery

## 2015-05-08 DIAGNOSIS — Z96612 Presence of left artificial shoulder joint: Principal | ICD-10-CM

## 2015-05-08 DIAGNOSIS — Z471 Aftercare following joint replacement surgery: Secondary | ICD-10-CM

## 2015-07-25 NOTE — H&P (Signed)
Shelia Barber is an 75 y.o. female.    Chief Complaint: left shoulder pain s/p replacement  HPI: Pt is a 75 y.o. female complaining of left shoulder pain for multiple years. Pain had continually increased since the beginning. X-rays in the clinic show acromion fracture left shoulder. Pt has tried various conservative treatments which have failed to alleviate their symptoms. Various options are discussed with the patient. Risks, benefits and expectations were discussed with the patient. Patient understand the risks, benefits and expectations and wishes to proceed with surgery.   PCP:  Tarri Fuller, MD  D/C Plans: Home  PMH: Past Medical History  Diagnosis Date  . Gastritis   . Sleep apnea   . Osteoporosis   . Neuropathy (HCC)   . Diverticulosis   . Hyperlipidemia     takes Atorvastatin daily  . Diabetes mellitus without complication (HCC)     takes Amaryl daily  . Osteoarthritis     takes Plaquenil daily  . RA (rheumatoid arthritis) (HCC)     Remicade every 8wks   . Hypertension     takes Losartan daily  . Seasonal allergies     takes Claritin daily  . GERD (gastroesophageal reflux disease)     takes Prevacid and Zantac daily  . History of colon polyps   . History of neck surgery 1993  . Cyst of kidney, acquired     right  . Peripheral neuropathy (HCC)   . TIA (transient ischemic attack)     x 3 and takes Plavix daily  . Migraines     frequent headaches recently but not migraines;last migraine a few months ago  . Joint pain   . Joint swelling   . Chronic back pain     DDD,scoliosis  . H/O hiatal hernia   . Urinary frequency   . Urinary urgency     PSH: Past Surgical History  Procedure Laterality Date  . Cesarean section  1963/1966  . Cardiac catheterization  04/29/2012    30% proximal LAD lesion, otherwise normal coronaries; LVEF 55% and no WMAs   . Tubal ligation  1966  . Appendectomy  1963  . Foot surgery Right 1968  . Colonoscopy    . Spurs removed  from feet Bilateral 1994  . Abdominal hysterectomy  1994  . Carpal tunnel release Bilateral 1995  . Cholecystectomy  2002  . Cyst removed from kidney Right 2002  . Cystoscopy  2002  . Cataract surgery Bilateral 2010  . Knee surgery Left 2010    d/t torn cartliage  . Joint replacement  2010    left knee  . Total shoulder arthroplasty Left 03/30/2014    Procedure: LEFT TOTAL REVERSE SHOULDER ARTHROPLASTY;  Surgeon: Verlee Rossetti, MD;  Location: [facility] OR;  Service: Orthopedics;  Laterality: Left;  . Reverse shoulder arthroplasty Left 04/28/2014    Procedure: IRRIGATION AND DEBRIDEMENT SHOULDER WITH POLY EXCHANGE;  Surgeon: Verlee Rossetti, MD;  Location: [facility] OR;  Service: Orthopedics;  Laterality: Left;  . Left heart catheterization with coronary angiogram N/A 04/29/2012    Procedure: LEFT HEART CATHETERIZATION WITH CORONARY ANGIOGRAM;  Surgeon: Wendall Stade, MD;  Location: [facility] CATH LAB;  Service: Cardiovascular;  Laterality: N/A;    Social History:  reports that she has never smoked. She does not have any smokeless tobacco history on file. She reports that she does not drink alcohol or use illicit drugs.  Allergies:  Allergies  Allergen Reactions  . Ivp Dye [Iodinated Diagnostic Agents] Anaphylaxis  .  Aspirin Other (See Comments)    Upset stomach Pt does take coated baby aspirin  . Imodium [Loperamide] Other (See Comments)    Due to Diverticulosis    Medications: No current facility-administered medications for this encounter.   Current Outpatient Prescriptions  Medication Sig Dispense Refill  . atorvastatin (LIPITOR) 20 MG tablet Take 20 mg by mouth daily.    . cetirizine (ZYRTEC) 10 MG tablet Take 10 mg by mouth daily.    . clopidogrel (PLAVIX) 75 MG tablet Take 75 mg by mouth daily.    Marland Kitchen gabapentin (NEURONTIN) 300 MG capsule Take 300 mg by mouth 4 (four) times daily.     Marland Kitchen glimepiride (AMARYL) 2 MG tablet Take 2 mg by mouth 2 (two) times daily.    Marland Kitchen HYDROcodone-acetaminophen  (NORCO/VICODIN) 5-325 MG tablet Take 1 tablet by mouth every 6 (six) hours as needed. 6 tablet 0  . hydroxychloroquine (PLAQUENIL) 200 MG tablet Take 200 mg by mouth 2 (two) times daily.    Marland Kitchen inFLIXimab (REMICADE) 100 MG injection Inject 100 mg into the vein every 8 (eight) weeks.    . lansoprazole (PREVACID) 15 MG capsule Take 15 mg by mouth 3 (three) times a week. Every Monday, Wednesday, friday    . loratadine (CLARITIN) 10 MG tablet Take 10 mg by mouth daily.    Marland Kitchen losartan (COZAAR) 25 MG tablet Take 25 mg by mouth daily.    . metFORMIN (GLUCOPHAGE) 1000 MG tablet Take 1,000 mg by mouth 2 (two) times daily with a meal.    . methocarbamol (ROBAXIN) 500 MG tablet Take 500 mg by mouth 2 (two) times daily.    . Probiotic Product (PROBIOTIC DAILY PO) Take 1 capsule by mouth daily. Equate Brand Probiotic    . ranitidine (ZANTAC) 150 MG tablet Take 150 mg by mouth daily.      No results found for this or any previous visit (from the past 48 hour(s)). No results found.  ROS: Pain with rom of the left upper extremity  Physical Exam:  Alert and oriented 76 y.o. female in no acute distress Cranial nerves 2-12 intact Cervical spine: full rom with no tenderness, nv intact distally Chest: active breath sounds bilaterally, no wheeze rhonchi or rales Heart: regular rate and rhythm, no murmur Abd: non tender non distended with active bowel sounds Hip is stable with rom  Left shoulder painful rom nv intact distally No rashes or edema Strength is limited   Assessment/Plan Assessment: left shoulder acromion fracture  Plan: Patient will undergo a left acromion ORIF by Dr. Ranell Patrick at [facility]. Risks benefits and expectations were discussed with the patient. Patient understand risks, benefits and expectations and wishes to proceed.

## 2015-07-30 ENCOUNTER — Encounter (HOSPITAL_COMMUNITY): Payer: Self-pay

## 2015-07-30 ENCOUNTER — Encounter (HOSPITAL_COMMUNITY)
Admission: RE | Admit: 2015-07-30 | Discharge: 2015-07-30 | Disposition: A | Discharge status: Home / Self Care | Payer: Medicare Other | Source: Ambulatory Visit | Attending: Orthopedic Surgery | Admitting: Orthopedic Surgery

## 2015-07-30 DIAGNOSIS — I10 Essential (primary) hypertension: Secondary | ICD-10-CM | POA: Diagnosis not present

## 2015-07-30 DIAGNOSIS — S42122A Displaced fracture of acromial process, left shoulder, initial encounter for closed fracture: Secondary | ICD-10-CM | POA: Insufficient documentation

## 2015-07-30 DIAGNOSIS — Z01818 Encounter for other preprocedural examination: Secondary | ICD-10-CM | POA: Diagnosis present

## 2015-07-30 DIAGNOSIS — E119 Type 2 diabetes mellitus without complications: Secondary | ICD-10-CM | POA: Insufficient documentation

## 2015-07-30 DIAGNOSIS — X58XXXA Exposure to other specified factors, initial encounter: Secondary | ICD-10-CM | POA: Insufficient documentation

## 2015-07-30 DIAGNOSIS — Z01812 Encounter for preprocedural laboratory examination: Secondary | ICD-10-CM | POA: Insufficient documentation

## 2015-07-30 LAB — BASIC METABOLIC PANEL
ANION GAP: 9 (ref 5–15)
BUN: 15 mg/dL (ref 6–20)
CALCIUM: 9 mg/dL (ref 8.9–10.3)
CO2: 26 mmol/L (ref 22–32)
Chloride: 110 mmol/L (ref 101–111)
Creatinine, Ser: 0.92 mg/dL (ref 0.44–1.00)
GLUCOSE: 124 mg/dL — AB (ref 65–99)
Potassium: 4.6 mmol/L (ref 3.5–5.1)
SODIUM: 145 mmol/L (ref 135–145)

## 2015-07-30 LAB — CBC
HCT: 40.1 % (ref 36.0–46.0)
Hemoglobin: 12.3 g/dL (ref 12.0–15.0)
MCH: 27.5 pg (ref 26.0–34.0)
MCHC: 30.7 g/dL (ref 30.0–36.0)
MCV: 89.5 fL (ref 78.0–100.0)
PLATELETS: 185 10*3/uL (ref 150–400)
RBC: 4.48 MIL/uL (ref 3.87–5.11)
RDW: 15.6 % — AB (ref 11.5–15.5)
WBC: 6.4 10*3/uL (ref 4.0–10.5)

## 2015-07-30 LAB — GLUCOSE, CAPILLARY: Glucose-Capillary: 77 mg/dL (ref 65–99)

## 2015-07-30 NOTE — Progress Notes (Signed)
Patient stated her last dose of plavix would be Sunday 08/04/15.

## 2015-07-30 NOTE — Pre-Procedure Instructions (Addendum)
Shelia Barber  07/30/2015      [facility] - [place] - [address] [place] [address] Phone: [phone] Fax: [phone]    Your procedure is scheduled on   Friday   08/09/15  Report to [facility] Admitting at 830 A.M.  Call this number if you have problems the morning of surgery:  [phone]   Remember:  Do not eat food or drink liquids after midnight.  Take these medicines the morning of surgery with A SIP OF WATER    GABAPENTIN, HYDROCODONE, LANSOPRAZOLE (PREVACID), RANITIDINE (ZANTAC), TOPIRAMATE (TOPAMAX)              (STOP CLOPIDOGREL/ PLAVIX, IBUPROFEN/ ADVIL/ MOTRIN, GOODY POWDERS/ BC'S, VITAMINS, HERBAL MEDICINES) How to Manage Your Diabetes Before Surgery   Why is it important to control my blood sugar before and after surgery?   Improving blood sugar levels before and after surgery helps healing and can limit problems.  A way of improving blood sugar control is eating a healthy diet by:  - Eating less sugar and carbohydrates  - Increasing activity/exercise  - Talk with your doctor about reaching your blood sugar goals  High blood sugars (greater than 180 mg/dL) can raise your risk of infections and slow down your recovery so you will need to focus on controlling your diabetes during the weeks before surgery.  Make sure that the doctor who takes care of your diabetes knows about your planned surgery including the date and location.  How do I manage my blood sugars before surgery?   Check your blood sugar at least 4 times a day, 2 days before surgery to make sure that they are not too high or low.   Check your blood sugar the morning of your surgery when you wake up and every 2               hours until you get to the Short-Stay unit.  If your blood sugar is less than 70 mg/dL, you will need to treat for low blood sugar by:  Treat a low blood sugar (less than 70 mg/dL) with 1/2 cup of clear juice  (cranberry or apple), 4 glucose tablets, OR glucose gel.  Recheck blood sugar in 15 minutes after treatment (to make sure it is greater than 70 mg/dL).  If blood sugar is not greater than 70 mg/dL on re-check, call [phone] for further instructions.   Report your blood sugar to the Short-Stay nurse when you get to Short-Stay.  References:  [facility], 2007 "How to Manage your Diabetes Before and After Surgery".  What do I do about my diabetes medications?   TAKE USUAL DOSE OF GLUCOPHAGE ON Thursday, BUT ONLY MORNING DOSE OF GLIMEPERIDE   Do not take oral diabetes medicines (pills) the morning of surgery.    Do not take other diabetes injectables the day of surgery including Byetta, Victoza, Bydureon, and Trulicity.    If your CBG is greater than 220 mg/dL, you may take 1/2 of your sliding scale (correction) dose of insulin.   For patients with "Insulin Pumps":  Contact your diabetes doctor for specific instructions before surgery.   Decrease basal insulin rates by 20% at midnight the night before surgery.  Note that if your surgery is planned to be longer than 2 hours, your insulin pump will be removed and intravenous (IV) insulin will be started and managed by the nurses and  anesthesiologist.  You will be able to restart your insulin pump once you are awake and able to manage it.  Make sure to bring insulin pump supplies to the hospital with you in case your site needs to be changed.        Do not wear jewelry, make-up or nail polish.  Do not wear lotions, powders, or perfumes.  You may wear deodorant.  Do not shave 48 hours prior to surgery.  Men may shave face and neck.  Do not bring valuables to the hospital.  [facility] is not responsible for any belongings or valuables.  Contacts, dentures or bridgework may not be worn into surgery.  Leave your suitcase in the car.  After surgery it may be brought to your room.  For patients admitted  to the hospital, discharge time will be determined by your treatment team.  Patients discharged the day of surgery will not be allowed to drive home.   Name and phone number of your driver:   Special instructions:  SEE PREPARING FOR SURGERY  Please read over the following fact sheets that you were given. Pain Booklet, Coughing and Deep Breathing and Surgical Site Infection Prevention

## 2015-08-08 MED ORDER — CEFAZOLIN SODIUM-DEXTROSE 2-3 GM-% IV SOLR
2.0000 g | INTRAVENOUS | Status: AC
  Administered 2015-08-09: 2 g via INTRAVENOUS
  Filled 2015-08-08: qty 50

## 2015-08-09 ENCOUNTER — Inpatient Hospital Stay (HOSPITAL_COMMUNITY): Payer: Medicare Other | Admitting: Anesthesiology

## 2015-08-09 ENCOUNTER — Encounter (HOSPITAL_COMMUNITY)
Admission: RE | Disposition: A | Discharge status: Home / Self Care | Payer: Self-pay | Source: Ambulatory Visit | Attending: Orthopedic Surgery

## 2015-08-09 ENCOUNTER — Encounter (HOSPITAL_COMMUNITY): Payer: Self-pay | Admitting: Anesthesiology

## 2015-08-09 ENCOUNTER — Inpatient Hospital Stay (HOSPITAL_COMMUNITY)
Admission: RE | Admit: 2015-08-09 | Discharge: 2015-08-11 | DRG: 517 | Disposition: A | Discharge status: Home / Self Care | Payer: Medicare Other | Source: Ambulatory Visit | Attending: Orthopedic Surgery | Admitting: Orthopedic Surgery

## 2015-08-09 DIAGNOSIS — I10 Essential (primary) hypertension: Secondary | ICD-10-CM | POA: Diagnosis present

## 2015-08-09 DIAGNOSIS — Z8673 Personal history of transient ischemic attack (TIA), and cerebral infarction without residual deficits: Secondary | ICD-10-CM

## 2015-08-09 DIAGNOSIS — M1990 Unspecified osteoarthritis, unspecified site: Secondary | ICD-10-CM | POA: Diagnosis present

## 2015-08-09 DIAGNOSIS — M069 Rheumatoid arthritis, unspecified: Secondary | ICD-10-CM | POA: Diagnosis present

## 2015-08-09 DIAGNOSIS — G8929 Other chronic pain: Secondary | ICD-10-CM | POA: Diagnosis present

## 2015-08-09 DIAGNOSIS — Z7902 Long term (current) use of antithrombotics/antiplatelets: Secondary | ICD-10-CM

## 2015-08-09 DIAGNOSIS — M4800 Spinal stenosis, site unspecified: Secondary | ICD-10-CM | POA: Diagnosis present

## 2015-08-09 DIAGNOSIS — J302 Other seasonal allergic rhinitis: Secondary | ICD-10-CM | POA: Diagnosis present

## 2015-08-09 DIAGNOSIS — E785 Hyperlipidemia, unspecified: Secondary | ICD-10-CM | POA: Diagnosis present

## 2015-08-09 DIAGNOSIS — G4730 Sleep apnea, unspecified: Secondary | ICD-10-CM | POA: Diagnosis present

## 2015-08-09 DIAGNOSIS — W19XXXA Unspecified fall, initial encounter: Secondary | ICD-10-CM | POA: Diagnosis present

## 2015-08-09 DIAGNOSIS — M549 Dorsalgia, unspecified: Secondary | ICD-10-CM | POA: Diagnosis present

## 2015-08-09 DIAGNOSIS — S42122A Displaced fracture of acromial process, left shoulder, initial encounter for closed fracture: Secondary | ICD-10-CM | POA: Diagnosis present

## 2015-08-09 DIAGNOSIS — M810 Age-related osteoporosis without current pathological fracture: Secondary | ICD-10-CM | POA: Diagnosis present

## 2015-08-09 DIAGNOSIS — S42109A Fracture of unspecified part of scapula, unspecified shoulder, initial encounter for closed fracture: Secondary | ICD-10-CM | POA: Diagnosis present

## 2015-08-09 DIAGNOSIS — M25512 Pain in left shoulder: Secondary | ICD-10-CM | POA: Diagnosis present

## 2015-08-09 DIAGNOSIS — Z96612 Presence of left artificial shoulder joint: Secondary | ICD-10-CM | POA: Diagnosis present

## 2015-08-09 DIAGNOSIS — K219 Gastro-esophageal reflux disease without esophagitis: Secondary | ICD-10-CM | POA: Diagnosis present

## 2015-08-09 DIAGNOSIS — Z96652 Presence of left artificial knee joint: Secondary | ICD-10-CM | POA: Diagnosis present

## 2015-08-09 DIAGNOSIS — E1140 Type 2 diabetes mellitus with diabetic neuropathy, unspecified: Secondary | ICD-10-CM | POA: Diagnosis present

## 2015-08-09 HISTORY — PX: OPEN REDUCTION INTERNAL FIXATION (ORIF) SCAPHOID WITH ILIAC CREST BONE GRAFT: SHX5668

## 2015-08-09 LAB — GLUCOSE, CAPILLARY
GLUCOSE-CAPILLARY: 103 mg/dL — AB (ref 65–99)
GLUCOSE-CAPILLARY: 129 mg/dL — AB (ref 65–99)
Glucose-Capillary: 93 mg/dL (ref 65–99)

## 2015-08-09 SURGERY — OPEN REDUCTION INTERNAL FIXATION (ORIF) SCAPHOID WITH ILIAC CREST BONE GRAFT
Anesthesia: Regional | Site: Shoulder | Laterality: Left

## 2015-08-09 MED ORDER — METHOCARBAMOL 1000 MG/10ML IJ SOLN
500.0000 mg | Freq: Four times a day (QID) | INTRAVENOUS | Status: DC | PRN
  Filled 2015-08-09: qty 5

## 2015-08-09 MED ORDER — PHENOL 1.4 % MT LIQD: 1.0000 | OROMUCOSAL | Status: DC | PRN

## 2015-08-09 MED ORDER — GLYCOPYRROLATE 0.2 MG/ML IJ SOLN
INTRAMUSCULAR | Status: AC
  Filled 2015-08-09: qty 2

## 2015-08-09 MED ORDER — PROPOFOL 10 MG/ML IV BOLUS
INTRAVENOUS | Status: DC | PRN
  Administered 2015-08-09: 150 mg via INTRAVENOUS

## 2015-08-09 MED ORDER — ACETAMINOPHEN 325 MG PO TABS: 650.0000 mg | ORAL_TABLET | Freq: Four times a day (QID) | ORAL | Status: DC | PRN

## 2015-08-09 MED ORDER — OXYCODONE HCL 5 MG PO TABS: 5.0000 mg | ORAL_TABLET | Freq: Once | ORAL | Status: DC | PRN

## 2015-08-09 MED ORDER — TOPIRAMATE 25 MG PO TABS
50.0000 mg | ORAL_TABLET | Freq: Every day | ORAL | Status: DC
  Administered 2015-08-10 – 2015-08-11 (×2): 50 mg via ORAL
  Filled 2015-08-09 (×2): qty 2

## 2015-08-09 MED ORDER — CLOPIDOGREL BISULFATE 75 MG PO TABS
75.0000 mg | ORAL_TABLET | Freq: Every day | ORAL | Status: DC
  Administered 2015-08-09 – 2015-08-11 (×3): 75 mg via ORAL
  Filled 2015-08-09 (×3): qty 1

## 2015-08-09 MED ORDER — BUPIVACAINE-EPINEPHRINE (PF) 0.25% -1:200000 IJ SOLN
INTRAMUSCULAR | Status: AC
  Filled 2015-08-09: qty 30

## 2015-08-09 MED ORDER — LIDOCAINE HCL (CARDIAC) 20 MG/ML IV SOLN
INTRAVENOUS | Status: DC | PRN
  Administered 2015-08-09: 60 mg via INTRAVENOUS

## 2015-08-09 MED ORDER — ONDANSETRON HCL 4 MG PO TABS: 4.0000 mg | ORAL_TABLET | Freq: Four times a day (QID) | ORAL | Status: DC | PRN

## 2015-08-09 MED ORDER — HYDROMORPHONE HCL 1 MG/ML IJ SOLN: 0.2500 mg | INTRAMUSCULAR | Status: DC | PRN

## 2015-08-09 MED ORDER — MENTHOL 3 MG MT LOZG: 1.0000 | LOZENGE | OROMUCOSAL | Status: DC | PRN

## 2015-08-09 MED ORDER — INFLIXIMAB 100 MG IV SOLR: 100.0000 mg | INTRAVENOUS | Status: DC

## 2015-08-09 MED ORDER — LORATADINE 10 MG PO TABS
10.0000 mg | ORAL_TABLET | Freq: Every day | ORAL | Status: DC
  Administered 2015-08-09 – 2015-08-11 (×3): 10 mg via ORAL
  Filled 2015-08-09 (×3): qty 1

## 2015-08-09 MED ORDER — BUPIVACAINE-EPINEPHRINE (PF) 0.5% -1:200000 IJ SOLN
INTRAMUSCULAR | Status: DC | PRN
  Administered 2015-08-09: 30 mL via PERINEURAL

## 2015-08-09 MED ORDER — METHOCARBAMOL 500 MG PO TABS
500.0000 mg | ORAL_TABLET | Freq: Four times a day (QID) | ORAL | Status: DC | PRN
  Administered 2015-08-09 – 2015-08-10 (×2): 500 mg via ORAL
  Filled 2015-08-09 (×2): qty 1

## 2015-08-09 MED ORDER — FAMOTIDINE 20 MG PO TABS
20.0000 mg | ORAL_TABLET | Freq: Two times a day (BID) | ORAL | Status: DC
  Administered 2015-08-09 – 2015-08-11 (×5): 20 mg via ORAL
  Filled 2015-08-09 (×5): qty 1

## 2015-08-09 MED ORDER — ONDANSETRON HCL 4 MG/2ML IJ SOLN
INTRAMUSCULAR | Status: DC | PRN
  Administered 2015-08-09: 4 mg via INTRAVENOUS

## 2015-08-09 MED ORDER — CHLORHEXIDINE GLUCONATE 4 % EX LIQD
60.0000 mL | Freq: Once | CUTANEOUS | Status: DC
Start: 2015-08-09 — End: 2015-08-09

## 2015-08-09 MED ORDER — BUPIVACAINE-EPINEPHRINE (PF) 0.25% -1:200000 IJ SOLN
INTRAMUSCULAR | Status: DC | PRN
  Administered 2015-08-09: 18 mL

## 2015-08-09 MED ORDER — ONDANSETRON HCL 4 MG/2ML IJ SOLN: 4.0000 mg | Freq: Four times a day (QID) | INTRAMUSCULAR | Status: DC | PRN

## 2015-08-09 MED ORDER — OXYCODONE HCL 5 MG PO TABS
5.0000 mg | ORAL_TABLET | ORAL | Status: DC | PRN
  Administered 2015-08-09 – 2015-08-10 (×2): 10 mg via ORAL
  Administered 2015-08-10: 5 mg via ORAL
  Filled 2015-08-09 (×3): qty 2

## 2015-08-09 MED ORDER — HYDROCODONE-ACETAMINOPHEN 5-325 MG PO TABS
1.0000 | ORAL_TABLET | Freq: Four times a day (QID) | ORAL | Status: DC | PRN
  Administered 2015-08-09 – 2015-08-11 (×3): 2 via ORAL
  Filled 2015-08-09 (×3): qty 2

## 2015-08-09 MED ORDER — MORPHINE SULFATE (PF) 2 MG/ML IV SOLN
2.0000 mg | INTRAVENOUS | Status: DC | PRN
  Administered 2015-08-09 – 2015-08-10 (×2): 2 mg via INTRAVENOUS
  Filled 2015-08-09 (×2): qty 1

## 2015-08-09 MED ORDER — PROPOFOL 10 MG/ML IV BOLUS
INTRAVENOUS | Status: AC
  Filled 2015-08-09: qty 20

## 2015-08-09 MED ORDER — CEFAZOLIN SODIUM-DEXTROSE 2-3 GM-% IV SOLR
2.0000 g | Freq: Four times a day (QID) | INTRAVENOUS | Status: AC
  Administered 2015-08-09 – 2015-08-10 (×3): 2 g via INTRAVENOUS
  Filled 2015-08-09 (×4): qty 50

## 2015-08-09 MED ORDER — GLIMEPIRIDE 2 MG PO TABS
2.0000 mg | ORAL_TABLET | Freq: Two times a day (BID) | ORAL | Status: DC
  Administered 2015-08-09 – 2015-08-11 (×4): 2 mg via ORAL
  Filled 2015-08-09 (×7): qty 1

## 2015-08-09 MED ORDER — METFORMIN HCL 500 MG PO TABS
1000.0000 mg | ORAL_TABLET | Freq: Two times a day (BID) | ORAL | Status: DC
  Administered 2015-08-09 – 2015-08-11 (×4): 1000 mg via ORAL
  Filled 2015-08-09 (×5): qty 2

## 2015-08-09 MED ORDER — METOCLOPRAMIDE HCL 5 MG PO TABS: 5.0000 mg | ORAL_TABLET | Freq: Three times a day (TID) | ORAL | Status: DC | PRN

## 2015-08-09 MED ORDER — OXYCODONE-ACETAMINOPHEN 5-325 MG PO TABS: 1.0000 | ORAL_TABLET | ORAL | Status: AC | PRN

## 2015-08-09 MED ORDER — METHOCARBAMOL 500 MG PO TABS: 500.0000 mg | ORAL_TABLET | Freq: Two times a day (BID) | ORAL | Status: DC | PRN

## 2015-08-09 MED ORDER — PHENYLEPHRINE HCL 10 MG/ML IJ SOLN
INTRAMUSCULAR | Status: DC | PRN
  Administered 2015-08-09: 40 ug via INTRAVENOUS
  Administered 2015-08-09 (×2): 80 ug via INTRAVENOUS

## 2015-08-09 MED ORDER — ROCURONIUM BROMIDE 100 MG/10ML IV SOLN
INTRAVENOUS | Status: DC | PRN
  Administered 2015-08-09: 40 mg via INTRAVENOUS

## 2015-08-09 MED ORDER — PANTOPRAZOLE SODIUM 40 MG PO TBEC
40.0000 mg | DELAYED_RELEASE_TABLET | Freq: Every day | ORAL | Status: DC
  Administered 2015-08-10 – 2015-08-11 (×2): 40 mg via ORAL
  Filled 2015-08-09 (×2): qty 1

## 2015-08-09 MED ORDER — LOSARTAN POTASSIUM 25 MG PO TABS
25.0000 mg | ORAL_TABLET | Freq: Every day | ORAL | Status: DC
  Administered 2015-08-09 – 2015-08-11 (×3): 25 mg via ORAL
  Filled 2015-08-09 (×3): qty 1

## 2015-08-09 MED ORDER — ROCURONIUM BROMIDE 50 MG/5ML IV SOLN
INTRAVENOUS | Status: AC
  Filled 2015-08-09: qty 1

## 2015-08-09 MED ORDER — NEOSTIGMINE METHYLSULFATE 10 MG/10ML IV SOLN
INTRAVENOUS | Status: DC | PRN
  Administered 2015-08-09: 3 mg via INTRAVENOUS

## 2015-08-09 MED ORDER — METHOCARBAMOL 500 MG PO TABS: 500.0000 mg | ORAL_TABLET | Freq: Three times a day (TID) | ORAL | Status: AC | PRN

## 2015-08-09 MED ORDER — METOCLOPRAMIDE HCL 5 MG/ML IJ SOLN: 5.0000 mg | Freq: Three times a day (TID) | INTRAMUSCULAR | Status: DC | PRN

## 2015-08-09 MED ORDER — FENTANYL CITRATE (PF) 100 MCG/2ML IJ SOLN
INTRAMUSCULAR | Status: DC | PRN
  Administered 2015-08-09: 50 ug via INTRAVENOUS

## 2015-08-09 MED ORDER — MIDAZOLAM HCL 2 MG/2ML IJ SOLN
INTRAMUSCULAR | Status: AC
  Filled 2015-08-09: qty 2

## 2015-08-09 MED ORDER — SACCHAROMYCES BOULARDII 250 MG PO CAPS
ORAL_CAPSULE | Freq: Every day | ORAL | Status: DC
  Administered 2015-08-09 – 2015-08-11 (×3): 250 mg via ORAL
  Filled 2015-08-09 (×3): qty 1

## 2015-08-09 MED ORDER — BENEFIBER DRINK MIX PO PACK: PACK | Freq: Every day | ORAL | Status: DC

## 2015-08-09 MED ORDER — LACTATED RINGERS IV SOLN
INTRAVENOUS | Status: DC
  Administered 2015-08-09: 09:00:00 via INTRAVENOUS

## 2015-08-09 MED ORDER — PHENYLEPHRINE HCL 10 MG/ML IJ SOLN
10.0000 mg | INTRAMUSCULAR | Status: DC | PRN
  Administered 2015-08-09: 50 ug/min via INTRAVENOUS

## 2015-08-09 MED ORDER — GABAPENTIN 300 MG PO CAPS
300.0000 mg | ORAL_CAPSULE | Freq: Three times a day (TID) | ORAL | Status: DC
  Administered 2015-08-09 – 2015-08-11 (×6): 300 mg via ORAL
  Filled 2015-08-09 (×6): qty 1

## 2015-08-09 MED ORDER — SODIUM CHLORIDE 0.9 % IV SOLN
INTRAVENOUS | Status: DC
  Administered 2015-08-09: 20:00:00 via INTRAVENOUS

## 2015-08-09 MED ORDER — LIDOCAINE HCL (CARDIAC) 20 MG/ML IV SOLN
INTRAVENOUS | Status: AC
  Filled 2015-08-09: qty 5

## 2015-08-09 MED ORDER — FENTANYL CITRATE (PF) 100 MCG/2ML IJ SOLN
INTRAMUSCULAR | Status: AC
Start: 2015-08-09 — End: 2015-08-09
  Administered 2015-08-09: 100 ug
  Filled 2015-08-09: qty 2

## 2015-08-09 MED ORDER — ACETAMINOPHEN 650 MG RE SUPP: 650.0000 mg | Freq: Four times a day (QID) | RECTAL | Status: DC | PRN

## 2015-08-09 MED ORDER — 0.9 % SODIUM CHLORIDE (POUR BTL) OPTIME
TOPICAL | Status: DC | PRN
  Administered 2015-08-09: 1000 mL

## 2015-08-09 MED ORDER — DOCUSATE SODIUM 100 MG PO CAPS
100.0000 mg | ORAL_CAPSULE | Freq: Two times a day (BID) | ORAL | Status: DC
  Administered 2015-08-09 – 2015-08-11 (×5): 100 mg via ORAL
  Filled 2015-08-09 (×5): qty 1

## 2015-08-09 MED ORDER — HYDROXYCHLOROQUINE SULFATE 200 MG PO TABS
200.0000 mg | ORAL_TABLET | Freq: Two times a day (BID) | ORAL | Status: DC
  Administered 2015-08-09 – 2015-08-11 (×5): 200 mg via ORAL
  Filled 2015-08-09 (×5): qty 1

## 2015-08-09 MED ORDER — OXYCODONE HCL 5 MG/5ML PO SOLN: 5.0000 mg | Freq: Once | ORAL | Status: DC | PRN

## 2015-08-09 MED ORDER — ATORVASTATIN CALCIUM 20 MG PO TABS
20.0000 mg | ORAL_TABLET | Freq: Every day | ORAL | Status: DC
  Administered 2015-08-09 – 2015-08-11 (×3): 20 mg via ORAL
  Filled 2015-08-09 (×3): qty 1

## 2015-08-09 MED ORDER — ACETAMINOPHEN 500 MG PO TABS
500.0000 mg | ORAL_TABLET | Freq: Every day | ORAL | Status: DC
  Administered 2015-08-09 – 2015-08-10 (×2): 500 mg via ORAL
  Filled 2015-08-09 (×2): qty 1

## 2015-08-09 MED ORDER — GLYCOPYRROLATE 0.2 MG/ML IJ SOLN
INTRAMUSCULAR | Status: DC | PRN
  Administered 2015-08-09: 0.4 mg via INTRAVENOUS

## 2015-08-09 MED ORDER — POLYETHYLENE GLYCOL 3350 17 G PO PACK: 17.0000 g | PACK | Freq: Every day | ORAL | Status: DC | PRN

## 2015-08-09 MED ORDER — FENTANYL CITRATE (PF) 250 MCG/5ML IJ SOLN
INTRAMUSCULAR | Status: AC
  Filled 2015-08-09: qty 5

## 2015-08-09 SURGICAL SUPPLY — 83 items
BANDAGE ELASTIC 3 VELCRO ST LF (GAUZE/BANDAGES/DRESSINGS) IMPLANT
BANDAGE ELASTIC 4 VELCRO ST LF (GAUZE/BANDAGES/DRESSINGS) IMPLANT
BIT DRILL 2.0 LNG QUCK RELEASE (BIT) ×1 IMPLANT
BIT DRILL 2.8 QUICK RELEASE (BIT) ×1 IMPLANT
BLADE SURG ROTATE 9660 (MISCELLANEOUS) ×3 IMPLANT
BNDG COHESIVE 4X5 TAN STRL (GAUZE/BANDAGES/DRESSINGS) ×3 IMPLANT
BNDG ESMARK 4X9 LF (GAUZE/BANDAGES/DRESSINGS) ×3 IMPLANT
BNDG GAUZE ELAST 4 BULKY (GAUZE/BANDAGES/DRESSINGS) IMPLANT
BONE CANC CHIPS 20CC PCAN1/4 (Bone Implant) ×3 IMPLANT
CHIPS CANC BONE 20CC PCAN1/4 (Bone Implant) ×1 IMPLANT
CLOSURE STERI-STRIP 1/2X4 (GAUZE/BANDAGES/DRESSINGS) ×2
CLOSURE WOUND 1/2 X4 (GAUZE/BANDAGES/DRESSINGS)
CLSR STERI-STRIP ANTIMIC 1/2X4 (GAUZE/BANDAGES/DRESSINGS) ×4 IMPLANT
COVER MAYO STAND STRL (DRAPES) ×3 IMPLANT
COVER SURGICAL LIGHT HANDLE (MISCELLANEOUS) ×3 IMPLANT
CUFF TOURNIQUET SINGLE 18IN (TOURNIQUET CUFF) IMPLANT
CUFF TOURNIQUET SINGLE 24IN (TOURNIQUET CUFF) IMPLANT
DECANTER SPIKE VIAL GLASS SM (MISCELLANEOUS) IMPLANT
DRAPE IMP U-DRAPE 54X76 (DRAPES) ×3 IMPLANT
DRAPE INCISE IOBAN 66X45 STRL (DRAPES) ×3 IMPLANT
DRAPE OEC MINIVIEW 54X84 (DRAPES) ×3 IMPLANT
DRAPE ORTHO SPLIT 77X108 STRL (DRAPES) ×9
DRAPE SURG ORHT 6 SPLT 77X108 (DRAPES) ×3 IMPLANT
DRAPE U-SHAPE 47X51 STRL (DRAPES) ×3 IMPLANT
DRAPE X RAY CASS MED 25220 (DRAPES) IMPLANT
DRILL 2.0 LNG QUICK RELEASE (BIT) ×3
DRILL 2.8 QUICK RELEASE (BIT) ×3
DRSG ADAPTIC 3X8 NADH LF (GAUZE/BANDAGES/DRESSINGS) IMPLANT
DRSG EMULSION OIL 3X3 NADH (GAUZE/BANDAGES/DRESSINGS) ×3 IMPLANT
DRSG PAD ABDOMINAL 8X10 ST (GAUZE/BANDAGES/DRESSINGS) IMPLANT
DURAPREP 26ML APPLICATOR (WOUND CARE) ×3 IMPLANT
ELECT NEEDLE TIP 2.8 STRL (NEEDLE) ×3 IMPLANT
ELECT REM PT RETURN 9FT ADLT (ELECTROSURGICAL) ×3
ELECTRODE REM PT RTRN 9FT ADLT (ELECTROSURGICAL) ×1 IMPLANT
GAUZE SPONGE 4X4 12PLY STRL (GAUZE/BANDAGES/DRESSINGS) IMPLANT
GLOVE BIO SURGEON STRL SZ 6 (GLOVE) ×3 IMPLANT
GLOVE BIOGEL PI ORTHO PRO SZ8 (GLOVE) ×2
GLOVE ORTHO TXT STRL SZ7.5 (GLOVE) ×3 IMPLANT
GLOVE PI ORTHO PRO STRL SZ8 (GLOVE) ×1 IMPLANT
GLOVE SURG ORTHO 8.5 STRL (GLOVE) ×3 IMPLANT
GOWN STRL REUS W/ TWL LRG LVL3 (GOWN DISPOSABLE) ×2 IMPLANT
GOWN STRL REUS W/ TWL XL LVL3 (GOWN DISPOSABLE) ×2 IMPLANT
GOWN STRL REUS W/TWL LRG LVL3 (GOWN DISPOSABLE) ×6
GOWN STRL REUS W/TWL XL LVL3 (GOWN DISPOSABLE) ×6
KIT BASIN OR (CUSTOM PROCEDURE TRAY) ×3 IMPLANT
KIT ROOM TURNOVER OR (KITS) ×3 IMPLANT
MANIFOLD NEPTUNE II (INSTRUMENTS) ×3 IMPLANT
NEEDLE 22X1 1/2 (OR ONLY) (NEEDLE) ×3 IMPLANT
NS IRRIG 1000ML POUR BTL (IV SOLUTION) ×3 IMPLANT
PACK ORTHO EXTREMITY (CUSTOM PROCEDURE TRAY) ×3 IMPLANT
PAD ABD 8X10 STRL (GAUZE/BANDAGES/DRESSINGS) ×3 IMPLANT
PAD ARMBOARD 7.5X6 YLW CONV (MISCELLANEOUS) ×6 IMPLANT
PAD CAST 4YDX4 CTTN HI CHSV (CAST SUPPLIES) IMPLANT
PADDING CAST COTTON 4X4 STRL (CAST SUPPLIES)
PLATE DISTAL CLAVICAL 13 HOLE (Plate) ×3 IMPLANT
SCREW 2.3X12MM (Screw) ×3 IMPLANT
SCREW BN FT 16X2.3XLCK HEX CRT (Screw) ×1 IMPLANT
SCREW CORTICAL LOCKING 2.3X14M (Screw) ×18 IMPLANT
SCREW CORTICAL LOCKING 2.3X16M (Screw) ×3 IMPLANT
SCREW HEX NON LOCK 3.5X26MM (Screw) ×3 IMPLANT
SCREW NON LOCKING HEX 3.5X18MM (Screw) ×3 IMPLANT
SCREW NON LOCKING HEX 3.5X22 (Screw) ×3 IMPLANT
SCREW NON LOCKING HEX 3.5X24 (Screw) ×3 IMPLANT
SPONGE GAUZE 4X4 12PLY STER LF (GAUZE/BANDAGES/DRESSINGS) ×3 IMPLANT
SPONGE LAP 4X18 X RAY DECT (DISPOSABLE) ×3 IMPLANT
STOCKINETTE IMPERVIOUS 9X36 MD (GAUZE/BANDAGES/DRESSINGS) ×3 IMPLANT
STRIP CLOSURE SKIN 1/2X4 (GAUZE/BANDAGES/DRESSINGS) IMPLANT
SUCTION FRAZIER HANDLE 10FR (MISCELLANEOUS) ×2
SUCTION TUBE FRAZIER 10FR DISP (MISCELLANEOUS) ×1 IMPLANT
SUT MNCRL AB 3-0 PS2 18 (SUTURE) ×3 IMPLANT
SUT VIC AB 0 CT1 27 (SUTURE) ×3
SUT VIC AB 0 CT1 27XBRD ANBCTR (SUTURE) ×1 IMPLANT
SUT VIC AB 2-0 CT1 27 (SUTURE) ×6
SUT VIC AB 2-0 CT1 TAPERPNT 27 (SUTURE) ×3 IMPLANT
SYR CONTROL 10ML LL (SYRINGE) ×3 IMPLANT
TAPE CLOTH SURG 6X10 WHT LF (GAUZE/BANDAGES/DRESSINGS) ×3 IMPLANT
TOWEL OR 17X24 6PK STRL BLUE (TOWEL DISPOSABLE) ×3 IMPLANT
TOWEL OR 17X26 10 PK STRL BLUE (TOWEL DISPOSABLE) ×3 IMPLANT
TUBE CONNECTING 12'X1/4 (SUCTIONS) ×1
TUBE CONNECTING 12X1/4 (SUCTIONS) ×2 IMPLANT
UNDERPAD 30X30 INCONTINENT (UNDERPADS AND DIAPERS) ×3 IMPLANT
WATER STERILE IRR 1000ML POUR (IV SOLUTION) ×3 IMPLANT
YANKAUER SUCT BULB TIP NO VENT (SUCTIONS) ×3 IMPLANT

## 2015-08-09 NOTE — Interval H&P Note (Signed)
History and Physical Interval Note:  08/09/2015 10:06 AM  Shelia Barber  has presented today for surgery, with the diagnosis of LEFT SHOULDER ACROMION FRACTURE   The various methods of treatment have been discussed with the patient and family. After consideration of risks, benefits and other options for treatment, the patient has consented to  Procedure(s): OPEN REDUCTION INTERNAL FIXATION (ORIF) LEFT SCAPULA ACROMION ALLOGRAFT  (Left) as a surgical intervention .  The patient's history has been reviewed, patient examined, no change in status, stable for surgery.  I have reviewed the patient's chart and labs.  Questions were answered to the patient's satisfaction.     Concha Sudol,STEVEN R

## 2015-08-09 NOTE — Discharge Instructions (Signed)
Ice to the shoulder as much as possible.  Wear the pillow sling at all times unless you prop the arm on the arm of a recliner or hug a pillow.  You will need help to get out of the sling.  Do not lift the left shoulder.  Lean TOWARDS the left side to slide the sling and pillow in and out.  Do not get the incision wet for one week.  Keep it covered.  No use of the left arm  Follow up with Dr Ranell Patrick in two weeks in the office  [phone]

## 2015-08-09 NOTE — Transfer of Care (Signed)
Immediate Anesthesia Transfer of Care Note  Patient: Shelia Barber  Procedure(s) Performed: Procedure(s): OPEN REDUCTION INTERNAL FIXATION (ORIF) LEFT SCAPULA ACROMION ALLOGRAFT  (Left)  Patient Location: PACU  Anesthesia Type:GA combined with regional for post-op pain  Level of Consciousness: awake  Airway & Oxygen Therapy: Patient Spontanous Breathing and Patient connected to face mask oxygen  Post-op Assessment: Report given to RN and Post -op Vital signs reviewed and stable  Post vital signs: Reviewed and stable  Last Vitals:  Filed Vitals:   08/09/15 0844 08/09/15 0934  BP: 97/63 116/56  Pulse: 67   Temp: 36.4 C   Resp: 16     Complications: No apparent anesthesia complications

## 2015-08-09 NOTE — Brief Op Note (Signed)
08/09/2015  1:06 PM  PATIENT:  Denny Levy  75 y.o. female  PRE-OPERATIVE DIAGNOSIS:  LEFT SHOULDER ACROMION FRACTURE   POST-OPERATIVE DIAGNOSIS:  LEFT SHOULDER ACROMION FRACTURE   PROCEDURE:  Procedure(s): OPEN REDUCTION INTERNAL FIXATION (ORIF) LEFT SCAPULA ACROMION ALLOGRAFT  (Left)  SURGEON:  Surgeon(s) and Role:    * Beverely Low, MD - Primary  PHYSICIAN ASSISTANT:   ASSISTANTS: Thea Gist, PA-C   ANESTHESIA:   regional and general  EBL:  Total I/O In: 700 [I.V.:700] Out: 50 [Blood:50]  BLOOD ADMINISTERED:none  DRAINS: none   LOCAL MEDICATIONS USED:  MARCAINE     SPECIMEN:  No Specimen  DISPOSITION OF SPECIMEN:  N/A  COUNTS:  YES  TOURNIQUET:  * No tourniquets in log *  DICTATION: .Other Dictation: Dictation Number [id number]  PLAN OF CARE: Admit to inpatient   PATIENT DISPOSITION:  PACU - hemodynamically stable.   Delay start of Pharmacological VTE agent (>24hrs) due to surgical blood loss or risk of bleeding: not applicable

## 2015-08-09 NOTE — Anesthesia Preprocedure Evaluation (Signed)
Anesthesia Evaluation  Patient identified by MRN, date of birth, ID band Patient awake    Reviewed: Allergy & Precautions, NPO status , Patient's Chart, lab work & pertinent test results  Airway Mallampati: II   Neck ROM: full    Dental   Pulmonary sleep apnea ,    breath sounds clear to auscultation       Cardiovascular hypertension,  Rhythm:regular Rate:Normal     Neuro/Psych  Headaches, TIA Neuromuscular disease    GI/Hepatic hiatal hernia, GERD  ,  Endo/Other  diabetes, Type obesity  Renal/GU      Musculoskeletal  (+) Arthritis , Rheumatoid disorders,    Abdominal   Peds  Hematology   Anesthesia Other Findings   Reproductive/Obstetrics                             Anesthesia Physical Anesthesia Plan  ASA: III  Anesthesia Plan: General and Regional   Post-op Pain Management:  Regional for Post-op pain   Induction: Intravenous  Airway Management Planned: Oral ETT  Additional Equipment:   Intra-op Plan:   Post-operative Plan: Extubation in OR  Informed Consent: I have reviewed the patients History and Physical, chart, labs and discussed the procedure including the risks, benefits and alternatives for the proposed anesthesia with the patient or authorized representative who has indicated his/her understanding and acceptance.     Plan Discussed with: CRNA, Anesthesiologist and Surgeon  Anesthesia Plan Comments:         Anesthesia Quick Evaluation

## 2015-08-09 NOTE — Anesthesia Procedure Notes (Addendum)
Anesthesia Regional Block:  Interscalene brachial plexus block  Pre-Anesthetic Checklist: ,, timeout performed, Correct Patient, Correct Site, Correct Laterality, Correct Procedure, Correct Position, site marked, Risks and benefits discussed,  Surgical consent,  Pre-op evaluation,  At surgeon's request and post-op pain management  Laterality: Left  Prep: chloraprep       Needles:  Injection technique: Single-shot  Needle Type: Echogenic Stimulator Needle     Needle Length: 5cm 5 cm Needle Gauge: 22 and 22 G    Additional Needles:  Procedures: ultrasound guided (picture in chart) and nerve stimulator Interscalene brachial plexus block  Nerve Stimulator or Paresthesia:  Response: biceps flexion, 0.45 mA,   Additional Responses:   Narrative:  Start time: 08/09/2015 9:43 AM End time: 08/09/2015 9:52 AM Injection made incrementally with aspirations every 5 mL.  Performed by: Personally  Anesthesiologist: HODIERNE, ADAM  Additional Notes: Functioning IV was confirmed and monitors were applied.  A 1mm 22ga Arrow echogenic stimulator needle was used. Sterile prep and drape,hand hygiene and sterile gloves were used.  Negative aspiration and negative test dose prior to incremental administration of local anesthetic. The patient tolerated the procedure well.  Ultrasound guidance: relevent anatomy identified, needle position confirmed, local anesthetic spread visualized around nerve(s), vascular puncture avoided.  Image printed for medical record.    Procedure Name: Intubation Date/Time: 08/09/2015 10:48 AM Performed by: Quentin Ore Pre-anesthesia Checklist: Patient identified, Emergency Drugs available, Suction available, Patient being monitored and Timeout performed Patient Re-evaluated:Patient Re-evaluated prior to inductionOxygen Delivery Method: Circle system utilized Preoxygenation: Pre-oxygenation with 100% oxygen Intubation Type: IV induction Ventilation: Mask ventilation  without difficulty Laryngoscope Size: Mac and 4 Grade View: Grade II Tube type: Oral Tube size: 7.0 mm Number of attempts: 1 Airway Equipment and Method: Stylet Placement Confirmation: ETT inserted through vocal cords under direct vision,  positive ETCO2 and breath sounds checked- equal and bilateral Secured at: 21 cm Tube secured with: Tape Dental Injury: Teeth and Oropharynx as per pre-operative assessment

## 2015-08-09 NOTE — Care Management (Signed)
Utilization review completed. Elder Davidian, RN Case Manager [phone]. 

## 2015-08-09 NOTE — Op Note (Signed)
NAMELENISE, JR NO.:  [id number]  MEDICAL RECORD NO.:  [id number]  LOCATION:  [facility]                         FACILITY:  [facility]  PHYSICIAN:  Almedia Balls. Ranell Patrick, M.D. DATE OF BIRTH:  1940/07/15  DATE OF PROCEDURE:  08/09/2015 DATE OF DISCHARGE:                              OPERATIVE REPORT   PREOPERATIVE DIAGNOSIS:  Left displaced acromion/scapular fracture.  POSTOPERATIVE DIAGNOSIS:  Left displaced acromion/scapular fracture.  PROCEDURE PERFORMED:  Open reduction and internal fixation and allograft, supplementation of left acromion/scapular fracture using Acumed plate system.  ATTENDING SURGEON:  Almedia Balls. Ranell Patrick, M.D.  ASSISTANT:  Donnie Coffin. Dixon, PA-C, who has scrubbed the entire procedure and necessary for satisfactory completion of surgery.  SECOND ASSISTANT:  Marlin Canary, PA-C.  ANESTHESIA:  General endotracheal anesthesia was used plus interscalene block.  ESTIMATED BLOOD LOSS:  100 mL.  FLUID REPLACED:  1500 mL crystalloid.  INSTRUMENT COUNTS:  Correct.  COMPLICATIONS:  There were no complications.  ANTIBIOTICS:  Perioperative antibiotics were given.  INDICATIONS:  The patient is a 75 year old female, who presents with history of worsening left shoulder pain after a fall, injuring her left shoulder.  The patient had a history of prior reverse shoulder replacement back in 2015.  Following her fall, she has had worsening pain in the shoulder.  Recent x-rays demonstrating displaced acromion fracture and the CT scan verifying the same.  The patient's reverse shoulder arthroplasty was well-fixed, well-positioned.  Given progression of pain, limitation of function, we elected in counseling with the patient and elected to proceed with surgical management of open reduction and internal fixation of the large acromial fragment.  Risks and benefits of surgery were discussed, informed consent obtained.  DESCRIPTION OF PROCEDURE:  After an adequate  level of anesthesia achieved, the patient was positioned in the modified beach-chair position.  Left shoulder correctly identified and sterilely prepped and draped in usual manner.  Time-out called.  We initiated surgery with the posterior incision over the subcutaneous scapular spine and extending up over the top of the acromion, dissection down in a single layer to the bone.  We were able to identify the native scapula and scapular spine right at the where it turns into the acromion as were was broken.  The acromion was depressed inferiorly at least a cm with no chance to heal. We went ahead and freed that up from the lateral side and we were able to completely mobilize that acromial piece.  We used rongeur to get back to bleeding bone both on the proximal piece and the distal piece.  Once we were confident, we could get the bone reduced.  Then, we went ahead and selected an AcuMed Titanium cluster plate specific to the acromion and we mounted that on the native scapular bone with four sturdy screws, these were 3.5 Titanium screws between 18 and 26 mm in thickness.  We had excellent purchase on the hard bone of the scapular spine.  We then went ahead and used a crab claw bone reduction clamps and reduced the acromion up to the plate, which then resulted in anatomic reduction. With the crab claws holding the bone reduced, we went ahead  and placed a total of eight locking screws.  These were 2.7 locking screws into the acromial fragment.  We had excellent purchase with those screws and we were able to remove our crab claws.  We had anatomic reduction based on x-ray, fluoro x-ray in surgery and then we paddled around the fracture site with an osteotome.  We irrigated thoroughly and then placed cancellous allograft, used about 10-15 mL of allograft chips in and around the fracture site.  We then went ahead and repaired the deep layer with 0 Vicryl suture.  This was a fascial and muscular  layer, and then we went ahead and did a subcu closure with 2-0 Vicryl and then staples for the skin.  A sterile compressive bandage applied and a shoulder abduction pillow sling, and the patient transported to the recovery room in stable condition.     Almedia Balls. Ranell Patrick, M.D.     SRN/MEDQ  D:  08/09/2015  T:  08/09/2015  Job:  [id number]

## 2015-08-10 LAB — BASIC METABOLIC PANEL
ANION GAP: 13 (ref 5–15)
BUN: 13 mg/dL (ref 6–20)
CHLORIDE: 109 mmol/L (ref 101–111)
CO2: 20 mmol/L — AB (ref 22–32)
Calcium: 8.4 mg/dL — ABNORMAL LOW (ref 8.9–10.3)
Creatinine, Ser: 0.8 mg/dL (ref 0.44–1.00)
GFR calc non Af Amer: >60 mL/min (ref >60)
GLUCOSE: 110 mg/dL — AB (ref 65–99)
Potassium: 4.7 mmol/L (ref 3.5–5.1)
Sodium: 142 mmol/L (ref 135–145)

## 2015-08-10 LAB — HEMOGLOBIN AND HEMATOCRIT, BLOOD
HCT: 36.5 % (ref 36.0–46.0)
HEMOGLOBIN: 11.6 g/dL — AB (ref 12.0–15.0)

## 2015-08-10 NOTE — Progress Notes (Signed)
Shelia Barber  MRN: [id number] DOB/Age: 10/10/1940 75 y.o. Physician: Lynnea Maizes, M.D. 1 Day Post-Op Procedure(s) (LRB): OPEN REDUCTION INTERNAL FIXATION (ORIF) LEFT SCAPULA ACROMION ALLOGRAFT  (Left)  Subjective: Reports moderate pain, mild nausea, no vomiting, tolerating po fluids.  Vital Signs Temp:  [97.5 F (36.4 C)-98 F (36.7 C)] 98 F (36.7 C) (03/18 0033) Pulse Rate:  [66-91] 78 (03/18 0033) Resp:  [16-24] 18 (03/18 0033) BP: (96-114)/(47-61) 109/60 mmHg (03/18 0033) SpO2:  [94 %-100 %] 94 % (03/18 0033)  Lab Results  Recent Labs  08/10/15 0649  HGB 11.6*  HCT 36.5   BMET  Recent Labs  08/10/15 0649  NA 142  K 4.7  CL 109  CO2 20*  GLUCOSE 110*  BUN 13  CREATININE 0.80  CALCIUM 8.4*   INR  Date Value Ref Range Status  04/28/2014 1.00 0.00 - 1.49 Final     Exam  Dressings dry L shoulder, n/v intact LUE. Sling and pillow adjusted for comfort and additional padding to minimize skin irritation.  Plan OT consult to help with transfers, ADL's, donning abduction pillow and sling and adjusting for comfort. Anticipate d/c sunday  Shelia Barber 08/10/2015, 11:42 AM    Contact # [phone]

## 2015-08-11 NOTE — Anesthesia Postprocedure Evaluation (Signed)
Anesthesia Post Note  Patient: Shelia Barber  Procedure(s) Performed: Procedure(s) (LRB): OPEN REDUCTION INTERNAL FIXATION (ORIF) LEFT SCAPULA ACROMION ALLOGRAFT  (Left)  Patient location during evaluation: PACU Anesthesia Type: General Level of consciousness: awake and alert and oriented Pain management: pain level controlled Vital Signs Assessment: post-procedure vital signs reviewed and stable Respiratory status: spontaneous breathing, nonlabored ventilation, respiratory function stable and patient connected to nasal cannula oxygen Cardiovascular status: blood pressure returned to baseline and stable Postop Assessment: no signs of nausea or vomiting Anesthetic complications: no              Matt Delpizzo A.

## 2015-08-11 NOTE — Progress Notes (Signed)
Occupational Therapy Treatment/Discharge Patient Details Name: Shelia Barber MRN: [id number] DOB: [birth year] Today's Date: 08/11/2015    History of present illness 75 y.o. female s/p ORIF of L scapula acromion allograft. PMH significant for HTN, HLD, DM type II, GERD, TIA, osteoarthritis, neuropathy, osteoporosis, urinal frequency and urgency, hx of neck surgery, L TSA, L TKA, and R foot surgery.   OT comments  Completed second session to educate pt's friend/roommate on how to don/doff sling, sling wear protocol, pillow positioning at rest, and compensatory strategies for dressing and bathing. Also provided pt/friend with tub transfer bench information as they expressed concern with what bathroom they would safely use once pt was cleared to shower. Reviewed fall prevention and energy conservation strategies as well. All education has been completed and pt has no further questions. OT signing off.    Follow Up Recommendations  Other (comment) (Progress shoulder rehab at MD discretion at follow up)    Equipment Recommendations  None recommended by OT    Recommendations for Other Services      Precautions / Restrictions Precautions Precautions: Shoulder Type of Shoulder Precautions: No AROM of LUE Shoulder Interventions: Shoulder sling/immobilizer;At all times;Shoulder abduction pillow Precaution Booklet Issued: Yes (comment) Precaution Comments: No AROM of LUE Required Braces or Orthoses: Sling Restrictions Weight Bearing Restrictions: Yes LUE Weight Bearing: Non weight bearing       Mobility Bed Mobility Overal bed mobility: Needs Assistance Bed Mobility: Supine to Sit;Sit to Supine     Supine to sit: Min assist;HOB elevated Sit to supine: Min guard   General bed mobility comments: HOB elevated, hand held assist to come to sitting. Pt reports she will be sleeping in her recliner as she has done for the past year due to back pain  Transfers Overall transfer level: Needs  assistance Equipment used: None Transfers: Sit to/from Stand Sit to Stand: Min guard         General transfer comment: Min guard for safety. Good demonstration of safe hand placement on seated surfaces.    Balance Overall balance assessment: Needs assistance Sitting-balance support: No upper extremity supported;Feet supported Sitting balance-Leahy Scale: Good     Standing balance support: No upper extremity supported;During functional activity Standing balance-Leahy Scale: Fair Standing balance comment: Able to maintain balance for static standing tasks, but required hand held assist for "furniture walked" for dyamic balance tasks                   ADL Overall ADL's : Needs assistance/impaired Eating/Feeding: Set up;Sitting   Grooming: Wash/dry hands;Supervision/safety;Standing   Upper Body Bathing: Moderate assistance;Sitting;Cueing for compensatory techniques   Lower Body Bathing: Minimal assistance;Sit to/from stand   Upper Body Dressing : Moderate assistance;Sitting;Cueing for compensatory techniques Upper Body Dressing Details (indicate cue type and reason): cues to put foam in place of abduction pillow Lower Body Dressing: Moderate assistance;Sit to/from stand   Toilet Transfer: Supervision/safety;Ambulation;Cueing for safety;BSC Toilet Transfer Details (indicate cue type and reason): Cues to place R hand on BSC handle before sitting Toileting- Clothing Manipulation and Hygiene: Minimal assistance;Sit to/from stand Toileting - Clothing Manipulation Details (indicate cue type and reason): To pull down pants on L side     Functional mobility during ADLs: Minimal assistance General ADL Comments: Reviewed all previous information with pt and pt's roommate/caregiver. Wrote on abudction pillow for proper positioning and provided tub transfer bench handout as pt/friend expressed concern about which shower they will be able to use once pt is cleared to shower. Reviewed  fall prevention and energy conservation strategies as well.      Vision                     Perception     Praxis      Cognition   Behavior During Therapy: WFL for tasks assessed/performed Overall Cognitive Status: Within Functional Limits for tasks assessed                       Extremity/Trunk Assessment  Upper Extremity Assessment Upper Extremity Assessment: LUE deficits/detail LUE: Unable to fully assess due to immobilization   Lower Extremity Assessment Lower Extremity Assessment: Generalized weakness   Cervical / Trunk Assessment Cervical / Trunk Assessment: Normal    Exercises Donning/doffing shirt without moving shoulder: Moderate assistance;Caregiver independent with task;Patient able to independently direct caregiver Method for sponge bathing under operated UE: Moderate assistance;Caregiver independent with task;Patient able to independently direct caregiver Donning/doffing sling/immobilizer: Moderate assistance;Caregiver independent with task;Patient able to independently direct caregiver Correct positioning of sling/immobilizer: Moderate assistance;Caregiver independent with task;Patient able to independently direct caregiver Sling wearing schedule (on at all times/off for ADL's): Independent Proper positioning of operated UE when showering: Independent Positioning of UE while sleeping: Independent   Shoulder Instructions Shoulder Instructions Donning/doffing shirt without moving shoulder: Moderate assistance;Caregiver independent with task;Patient able to independently direct caregiver Method for sponge bathing under operated UE: Moderate assistance;Caregiver independent with task;Patient able to independently direct caregiver Donning/doffing sling/immobilizer: Moderate assistance;Caregiver independent with task;Patient able to independently direct caregiver Correct positioning of sling/immobilizer: Moderate assistance;Caregiver independent with  task;Patient able to independently direct caregiver Sling wearing schedule (on at all times/off for ADL's): Independent Proper positioning of operated UE when showering: Independent Positioning of UE while sleeping: Independent     General Comments      Pertinent Vitals/ Pain       Pain Assessment: 0-10 Pain Score: 5  Pain Location: Low back and L shoulder Pain Descriptors / Indicators: Aching Pain Intervention(s): Limited activity within patient's tolerance;Monitored during session;Repositioned;Ice applied;Premedicated before session  Home Living Family/patient expects to be discharged to:: Private residence Living Arrangements: Non-relatives/Friends Available Help at Discharge: Friend(s);Available 24 hours/day;Neighbor Type of Home: House Home Access: Stairs to enter [employer] of Steps: 3 Entrance Stairs-Rails: Right;Left Home Layout: One level     Bathroom Shower/Tub: Walk-in Hydrologist: Handicapped height Bathroom Accessibility: Yes How Accessible: Accessible via walker Home Equipment: [place] - 2 wheels;Cane - single point;Bedside commode;Shower seat;Hand held shower head;Adaptive equipment Adaptive Equipment: Long-handled sponge        Prior Functioning/Environment Level of Independence: Independent with assistive device(s)        Comments: Uses SPC occasionally   Frequency Min 2X/week     Progress Toward Goals  OT Goals(current goals can now be found in the care plan section)  Progress towards OT goals: Goals met/education completed, patient discharged from OT  Acute Rehab OT Goals Patient Stated Goal: to get back to being independent OT Goal Formulation: With patient Time For Goal Achievement: 08/25/15 Potential to Achieve Goals: Good ADL Goals Pt Will Perform Upper Body Bathing: with min assist;with caregiver independent in assisting;sitting (while using foam in place of abduction pillow) Pt Will Perform Upper Body  Dressing: with min assist;with caregiver independent in assisting;sitting (while using foam in place of abduction pillow) Pt Will Transfer to Toilet: with supervision;ambulating;bedside commode (with BSC over toilet) Pt Will Perform Toileting - Clothing Manipulation and hygiene: with supervision;sitting/lateral leans;sit to/from stand Additional  ADL Goal #1: Pt/caregiver will independently don/doff sling and abduction pillow to increase independence and safety with ADLs and mobility.  Plan All goals met and education completed, patient discharged from OT services    Co-evaluation                 End of Session Equipment Utilized During Treatment: Gait belt;Other (comment) (sling with abduction pillow)   Activity Tolerance Patient tolerated treatment well   Patient Left in bed;with call bell/phone within reach;with family/visitor present;with nursing/sitter in room   Nurse Communication Mobility status        Time: 1751-0258 OT Time Calculation (min): 24 min  Charges: OT General Charges $OT Visit: 1 Procedure OT Evaluation $OT Eval Moderate Complexity: 1 Procedure OT Treatments $Self Care/Home Management : 23-37 mins  Redmond Baseman, OTR/L Pager: [phone] 08/11/2015, 1:49 PM

## 2015-08-11 NOTE — Progress Notes (Signed)
Patient A/O, no noted distress. Tolerated meds well. Educated patient and spouse on discharge instructions: dressing change, hand hygiene, prescription, and follow up. Patient and spouse had no concerns. Spouse verbal noted that he understood the dressing change. Give spouse supplies for dressing change. Patient noted he felt comfortable and had enough supplies. Staff wheeled patient down to meet spouse.

## 2015-08-11 NOTE — Progress Notes (Signed)
    Subjective: 2 Days Post-Op Procedure(s) (LRB): OPEN REDUCTION INTERNAL FIXATION (ORIF) LEFT SCAPULA ACROMION ALLOGRAFT  (Left) Patient reports pain as 5 on 0-10 scale.  (due mostly to LBP) Denies CP or SOB.  Voiding without difficulty. Positive flatus. Objective: Vital signs in last 24 hours: Temp:  [98.2 F (36.8 C)-98.7 F (37.1 C)] 98.7 F (37.1 C) (03/19 0500) Pulse Rate:  [53-85] 53 (03/19 0500) Resp:  [18-20] 20 (03/19 0500) BP: (116-136)/(60-71) 116/60 mmHg (03/19 0500) SpO2:  [92 %-96 %] 93 % (03/19 0500)  Intake/Output from previous day: 03/18 0701 - 03/19 0700 In: 480 [P.O.:480] Out: -  Intake/Output this shift:    Labs:  Recent Labs  08/10/15 0649  HGB 11.6*    Recent Labs  08/10/15 0649  HCT 36.5    Recent Labs  08/10/15 0649  NA 142  K 4.7  CL 109  CO2 20*  BUN 13  CREATININE 0.80  GLUCOSE 110*  CALCIUM 8.4*   No results for input(s): LABPT, INR in the last 72 hours.  Physical Exam: Neurologically intact Intact pulses distally Incision: dressing C/D/I Compartment soft  Assessment/Plan: 2 Days Post-Op Procedure(s) (LRB): OPEN REDUCTION INTERNAL FIXATION (ORIF) LEFT SCAPULA ACROMION ALLOGRAFT  (Left) Advance diet Up with therapy  Patient pain secondary to spinal stenosis. Will discuss outpatient lumbar ESI with Dr Ranell Patrick Plan on d/c to home today  [facility] D for Dr. Venita Lick [facility] [phone] 08/11/2015, 6:29 AM

## 2015-08-11 NOTE — Progress Notes (Signed)
Occupational Therapy Evaluation Patient Details Name: Shelia Barber MRN: [id number] DOB: Jun 03, 1940 Today's Date: 08/11/2015    History of Present Illness 75 y.o. female s/p ORIF of L scapula acromion allograft. PMH significant for HTN, HLD, DM type II, GERD, TIA, osteoarthritis, neuropathy, osteoporosis, urinal frequency and urgency, hx of neck surgery, L TSA, L TKA, and R foot surgery.   Clinical Impression   PTA, pt was independent with ADLs and use SPC occasionally for mobility. Pt currently requires mod assist for dressing/bathing, min guard assist for transfers and min assist (hand held) for ambulation. Reviewed LUE precautions and NWB status, positioning of pillows at rest, how to don/doff sling and abduction pillow, and sling wear protocol. Practiced UB dressing/bathing with compensatory strategies and transfers with DME. Pt plans to d/c home with 24/7 assistance from her friend/roommate. Pt will benefit from continued acute OT to increase independence and safety with ADLs and mobility to allow for safe discharge home.     Follow Up Recommendations  Other (comment) (Progress shoulder rehab at MD discretion at follow up)    Equipment Recommendations  None recommended by OT    Recommendations for Other Services       Precautions / Restrictions Precautions Precautions: Shoulder Type of Shoulder Precautions: No AROM of LUE Shoulder Interventions: Shoulder sling/immobilizer;At all times;Shoulder abduction pillow Precaution Booklet Issued: Yes (comment) Precaution Comments: No AROM of LUE Required Braces or Orthoses: Sling Restrictions Weight Bearing Restrictions: Yes LUE Weight Bearing: Non weight bearing      Mobility Bed Mobility Overal bed mobility: Needs Assistance Bed Mobility: Supine to Sit;Sit to Supine     Supine to sit: Min assist;HOB elevated Sit to supine: Min guard   General bed mobility comments: HOB elevated, hand held assist to come to sitting. Pt  reports she will be sleeping in her recliner as she has done for the past year due to back pain  Transfers Overall transfer level: Needs assistance Equipment used: None Transfers: Sit to/from Stand Sit to Stand: Min guard         General transfer comment: Min guard for safety. Good demonstration of safe hand placement on seated surfaces.    Balance Overall balance assessment: Needs assistance Sitting-balance support: No upper extremity supported;Feet supported Sitting balance-Leahy Scale: Good     Standing balance support: No upper extremity supported;During functional activity Standing balance-Leahy Scale: Fair Standing balance comment: Able to maintain balance for static standing tasks, but required hand held assist for "furniture walked" for dyamic balance tasks                            ADL Overall ADL's : Needs assistance/impaired Eating/Feeding: Set up;Sitting   Grooming: Wash/dry hands;Supervision/safety;Standing   Upper Body Bathing: Moderate assistance;Sitting;Cueing for compensatory techniques   Lower Body Bathing: Minimal assistance;Sit to/from stand   Upper Body Dressing : Moderate assistance;Sitting;Cueing for compensatory techniques Upper Body Dressing Details (indicate cue type and reason): cues to put foam in place of abduction pillow Lower Body Dressing: Moderate assistance;Sit to/from stand   Toilet Transfer: Supervision/safety;Ambulation;Cueing for safety;BSC Toilet Transfer Details (indicate cue type and reason): Cues to place R hand on BSC handle before sitting Toileting- Clothing Manipulation and Hygiene: Minimal assistance;Sit to/from stand Toileting - Clothing Manipulation Details (indicate cue type and reason): To pull down pants on L side     Functional mobility during ADLs: Minimal assistance General ADL Comments: Educated pt on LUE precautions, NWB LUE status, how to  don/doff sling, sling wear protocol, compensastory strategies for  UB dressing and bathing including using foam in place of abduction pillow, and use of AE and DME for fall prevention and energy conservation.      Vision Vision Assessment?: No apparent visual deficits   Perception     Praxis      Pertinent Vitals/Pain Pain Assessment: 0-10 Pain Score: 7  Pain Location: Mostly low back and L shoulder Pain Descriptors / Indicators: Aching;Sore Pain Intervention(s): Limited activity within patient's tolerance;Monitored during session;Repositioned;RN gave pain meds during session;Ice applied     Hand Dominance Right   Extremity/Trunk Assessment Upper Extremity Assessment Upper Extremity Assessment: LUE deficits/detail LUE: Unable to fully assess due to immobilization   Lower Extremity Assessment Lower Extremity Assessment: Generalized weakness   Cervical / Trunk Assessment Cervical / Trunk Assessment: Normal   Communication Communication Communication: No difficulties   Cognition Arousal/Alertness: Awake/alert Behavior During Therapy: WFL for tasks assessed/performed Overall Cognitive Status: Within Functional Limits for tasks assessed                     General Comments       Exercises Exercises: Shoulder     Shoulder Instructions Shoulder Instructions Donning/doffing shirt without moving shoulder: Moderate assistance Method for sponge bathing under operated UE: Moderate assistance Donning/doffing sling/immobilizer: Moderate assistance Correct positioning of sling/immobilizer: Moderate assistance Sling wearing schedule (on at all times/off for ADL's): Independent Proper positioning of operated UE when showering: Independent Positioning of UE while sleeping: Independent    Home Living Family/patient expects to be discharged to:: Private residence Living Arrangements: Non-relatives/Friends Available Help at Discharge: Friend(s);Available 24 hours/day;Neighbor Type of Home: House Home Access: Stairs to enter [employer] of Steps: 3 Entrance Stairs-Rails: Right;Left Home Layout: One level     Bathroom Shower/Tub: Walk-in Pensions consultant: Handicapped height Bathroom Accessibility: Yes How Accessible: Accessible via walker Home Equipment: Walker - 2 wheels;Cane - single point;Bedside commode;Shower seat;Hand held shower head;Adaptive equipment Adaptive Equipment: Long-handled sponge        Prior Functioning/Environment Level of Independence: Independent with assistive device(s)        Comments: Uses SPC occasionally    OT Diagnosis: Generalized weakness;Acute pain   OT Problem List: Decreased strength;Decreased range of motion;Decreased activity tolerance;Impaired balance (sitting and/or standing);Decreased coordination;Decreased safety awareness;Decreased knowledge of use of DME or AE;Decreased knowledge of precautions;Pain;Obesity;Impaired UE functional use   OT Treatment/Interventions: Self-care/ADL training;Therapeutic exercise;Energy conservation;DME and/or AE instruction;Therapeutic activities;Patient/family education;Balance training    OT Goals(Current goals can be found in the care plan section) Acute Rehab OT Goals Patient Stated Goal: to get back to being independent OT Goal Formulation: With patient Time For Goal Achievement: 08/25/15 Potential to Achieve Goals: Good ADL Goals Pt Will Perform Upper Body Bathing: with min assist;with caregiver independent in assisting;sitting (while using foam in place of abduction pillow) Pt Will Perform Upper Body Dressing: with min assist;with caregiver independent in assisting;sitting (while using foam in place of abduction pillow) Pt Will Transfer to Toilet: with supervision;ambulating;bedside commode (with BSC over toilet) Pt Will Perform Toileting - Clothing Manipulation and hygiene: with supervision;sitting/lateral leans;sit to/from stand Additional ADL Goal #1: Pt/caregiver will independently don/doff sling and  abduction pillow to increase independence and safety with ADLs and mobility.  OT Frequency: Min 2X/week   Barriers to D/C:            Co-evaluation              End of Session Equipment  Utilized During Treatment: Gait belt;Other (comment) (sling with abduction pillow) Nurse Communication: Mobility status;Other (comment) (Pt will need 2nd session prior to discharge)  Activity Tolerance: Patient tolerated treatment well Patient left: in bed;with call bell/phone within reach   Time: 0859-1008 OT Time Calculation (min): 69 min Charges:  OT General Charges $OT Visit: 1 Procedure OT Evaluation $OT Eval Moderate Complexity: 1 Procedure OT Treatments $Self Care/Home Management : 38-52 mins G-Codes:    Nils Pyle, OTR/L Pager: [phone] 08/11/2015, 1:40 PM

## 2015-08-12 ENCOUNTER — Encounter (HOSPITAL_COMMUNITY): Payer: Self-pay | Admitting: Orthopedic Surgery

## 2015-08-23 NOTE — Discharge Summary (Signed)
Patient ID: Shelia Barber MRN: [id number] DOB/AGE: [birth year] 74 y.o.  Admit date: 08/09/2015 Discharge date: 08/23/2015  Admission Diagnoses:  Active Problems:   Scapula fracture   Discharge Diagnoses:  Active Problems:   Scapula fracture  status post Procedure(s): OPEN REDUCTION INTERNAL FIXATION (ORIF) LEFT SCAPULA ACROMION ALLOGRAFT   Past Medical History  Diagnosis Date  . Gastritis   . Osteoporosis   . Neuropathy (HCC)   . Diverticulosis   . Hyperlipidemia     takes Atorvastatin daily  . Diabetes mellitus without complication (HCC)     takes Amaryl daily  . Osteoarthritis     takes Plaquenil daily  . RA (rheumatoid arthritis) (HCC)     Remicade every 8wks   . Hypertension     takes Losartan daily  . Seasonal allergies     takes Claritin daily  . GERD (gastroesophageal reflux disease)     takes Prevacid and Zantac daily  . History of colon polyps   . History of neck surgery 1993  . Cyst of kidney, acquired     right  . Peripheral neuropathy (HCC)   . TIA (transient ischemic attack)     x 3 and takes Plavix daily  . Migraines     frequent headaches recently but not migraines;last migraine a few months ago  . Joint pain   . Joint swelling   . Chronic back pain     DDD,scoliosis  . H/O hiatal hernia   . Urinary frequency   . Urinary urgency   . Sleep apnea     DOES NOT USE CPAP    Surgeries: Procedure(s): OPEN REDUCTION INTERNAL FIXATION (ORIF) LEFT SCAPULA ACROMION ALLOGRAFT  on 08/09/2015   Consultants:    Discharged Condition: Improved  Hospital Course: Shelia Barber is an 75 y.o. female who was admitted 08/09/2015 for operative treatment of <principal problem not specified>. Patient failed conservative treatments (please see the history and physical for the specifics) and had severe unremitting pain that affects sleep, daily activities and work/hobbies. After pre-op clearance, the patient was taken to the operating room on 08/09/2015 and underwent   Procedure(s): OPEN REDUCTION INTERNAL FIXATION (ORIF) LEFT SCAPULA ACROMION ALLOGRAFT .  Discharged the pt on 08/11/15  Patient was given perioperative antibiotics:  Anti-infectives    Start     Dose/Rate Route Frequency Ordered Stop   08/09/15 1630  ceFAZolin (ANCEF) IVPB 2 g/50 mL premix     2 g 100 mL/hr over 30 Minutes Intravenous Every 6 hours 08/09/15 1429 08/10/15 0443   08/09/15 1430  hydroxychloroquine (PLAQUENIL) tablet 200 mg  Status:  Discontinued     200 mg Oral 2 times daily 08/09/15 1429 08/11/15 1541   08/09/15 1000  ceFAZolin (ANCEF) IVPB 2 g/50 mL premix     2 g 100 mL/hr over 30 Minutes Intravenous To [facility] 08/08/15 1112 08/09/15 1100       Patient was given sequential compression devices and early ambulation to prevent DVT.   Patient benefited maximally from hospital stay and there were no complications. At the time of discharge, the patient was urinating/moving their bowels without difficulty, tolerating a regular diet, pain is controlled with oral pain medications and they have been cleared by PT/OT.   Recent vital signs: No data found.    Recent laboratory studies: No results for input(s): WBC, HGB, HCT, PLT, NA, K, CL, CO2, BUN, CREATININE, GLUCOSE, INR, CALCIUM in the last 72 hours.  Invalid input(s): PT, 2  Discharge Medications:     Medication List    TAKE these medications        acetaminophen 500 MG tablet  Commonly known as:  TYLENOL  Take 500 mg by mouth at bedtime.     atorvastatin 20 MG tablet  Commonly known as:  LIPITOR  Take 20 mg by mouth daily.     BENEFIBER DRINK MIX PO  Take 3 each by mouth daily. Mix 3 tablespoons in liquid and drink once daily.     clopidogrel 75 MG tablet  Commonly known as:  PLAVIX  Take 75 mg by mouth daily.     gabapentin 300 MG capsule  Commonly known as:  NEURONTIN  Take 300 mg by mouth 3 (three) times daily.     glimepiride 2 MG tablet  Commonly known as:  AMARYL  Take 2 mg by mouth  2 (two) times daily.     HYDROcodone-acetaminophen 5-325 MG tablet  Commonly known as:  NORCO/VICODIN  Take 1 tablet by mouth every 6 (six) hours as needed.     hydroxychloroquine 200 MG tablet  Commonly known as:  PLAQUENIL  Take 200 mg by mouth 2 (two) times daily.     inFLIXimab 100 MG injection  Commonly known as:  REMICADE  Inject 100 mg into the vein every 8 (eight) weeks.     lansoprazole 15 MG capsule  Commonly known as:  PREVACID  Take 15 mg by mouth 3 (three) times a week. Every Monday, Wednesday, friday     loratadine 10 MG tablet  Commonly known as:  CLARITIN  Take 10 mg by mouth daily.     losartan 25 MG tablet  Commonly known as:  COZAAR  Take 25 mg by mouth daily.     metFORMIN 1000 MG tablet  Commonly known as:  GLUCOPHAGE  Take 1,000 mg by mouth 2 (two) times daily with a meal.     methocarbamol 500 MG tablet  Commonly known as:  ROBAXIN  Take 1 tablet (500 mg total) by mouth 3 (three) times daily as needed.     methocarbamol 500 MG tablet  Commonly known as:  ROBAXIN  Take 500 mg by mouth 2 (two) times daily as needed for muscle spasms.     oxyCODONE-acetaminophen 5-325 MG tablet  Commonly known as:  ROXICET  Take 1-2 tablets by mouth every 4 (four) hours as needed for severe pain.     PROBIOTIC DAILY PO  Take 1 capsule by mouth daily. Equate Brand Probiotic     ranitidine 150 MG tablet  Commonly known as:  ZANTAC  Take 150 mg by mouth daily.     topiramate 50 MG tablet  Commonly known as:  TOPAMAX  Take 50 mg by mouth daily.        Diagnostic Studies: No results found.        Follow-up Information    Follow up with NORRIS,STEVEN R, MD. Call in 2 weeks.   Specialty:  Orthopedic Surgery   Why:  [phone]   Contact information:   [address] [place] [address] [phone]       Discharge Plan:  discharge to   Disposition:     Signed: MayoBaxter Kail for Dr. Venita Lick [facility]  Orthopaedics [phone] 08/23/2015, 12:53 PM

## 2016-01-17 ENCOUNTER — Encounter (HOSPITAL_COMMUNITY): Payer: Self-pay | Admitting: Family Medicine

## 2016-01-17 ENCOUNTER — Ambulatory Visit (INDEPENDENT_AMBULATORY_CARE_PROVIDER_SITE_OTHER): Payer: Medicare Other

## 2016-01-17 ENCOUNTER — Ambulatory Visit (HOSPITAL_COMMUNITY)
Admission: EM | Admit: 2016-01-17 | Discharge: 2016-01-17 | Disposition: A | Discharge status: Home / Self Care | Payer: Medicare Other | Attending: Physician Assistant | Admitting: Physician Assistant

## 2016-01-17 DIAGNOSIS — R609 Edema, unspecified: Secondary | ICD-10-CM

## 2016-01-17 DIAGNOSIS — R600 Localized edema: Secondary | ICD-10-CM | POA: Diagnosis not present

## 2016-01-17 NOTE — ED Triage Notes (Signed)
Pt here for bilateral leg swelling that has been going on x 1 week. sts she takes her lasix when needed and has taken it the past 2 days. sts she has been elevating feet and cant get the swelling down. sts also some pain in right foot around her great toe and 2nd toe. sts some neuropathy.

## 2016-01-17 NOTE — Discharge Instructions (Signed)
Increase your furosemide from once a day to twice daily for 1 week.   Keep legs elevated  Limit salt use.

## 2016-01-17 NOTE — ED Provider Notes (Signed)
CSN: [id number]     Arrival date & time 01/17/16  1315 History   None    Chief Complaint  Patient presents with  . Leg Swelling   (Consider location/radiation/quality/duration/timing/severity/associated sxs/prior Treatment) HPI 75 year old female states that she has been doing a lot of sitting recently and has developed swelling of both lower legs. She states that the swelling is worse on the right than the left. She does admit that the swelling does get better after keeping the legs elevated. She denies any chest pain some ongoing shortness of breath which she has had for many years now. She attributes this shortness of breath to her weight. Patient has minimal discomfort in her legs at this time. She does take furosemide. Past Medical History:  Diagnosis Date  . Chronic back pain    DDD,scoliosis  . Cyst of kidney, acquired    right  . Diabetes mellitus without complication (HCC)    takes Amaryl daily  . Diverticulosis   . Gastritis   . GERD (gastroesophageal reflux disease)    takes Prevacid and Zantac daily  . H/O hiatal hernia   . History of colon polyps   . History of neck surgery 1993  . Hyperlipidemia    takes Atorvastatin daily  . Hypertension    takes Losartan daily  . Joint pain   . Joint swelling   . Migraines    frequent headaches recently but not migraines;last migraine a few months ago  . Neuropathy (HCC)   . Osteoarthritis    takes Plaquenil daily  . Osteoporosis   . Peripheral neuropathy (HCC)   . RA (rheumatoid arthritis) (HCC)    Remicade every 8wks   . Seasonal allergies    takes Claritin daily  . Sleep apnea    DOES NOT USE CPAP  . TIA (transient ischemic attack)    x 3 and takes Plavix daily  . Urinary frequency   . Urinary urgency    Past Surgical History:  Procedure Laterality Date  . ABDOMINAL HYSTERECTOMY  1994  . APPENDECTOMY  1963  . CARDIAC CATHETERIZATION  04/29/2012   30% proximal LAD lesion, otherwise normal coronaries; LVEF 55% and  no WMAs   . CARPAL TUNNEL RELEASE Bilateral 1995  . cataract surgery Bilateral 2010  . CESAREAN SECTION  1963/1966  . CHOLECYSTECTOMY  2002  . COLONOSCOPY    . cyst removed from kidney Right 2002  . CYSTOSCOPY  2002  . FOOT SURGERY Right 1968  . JOINT REPLACEMENT  2010   left knee  . KNEE SURGERY Left 2010   d/t torn cartliage  . LEFT HEART CATHETERIZATION WITH CORONARY ANGIOGRAM N/A 04/29/2012   Procedure: LEFT HEART CATHETERIZATION WITH CORONARY ANGIOGRAM;  Surgeon: Wendall Stade, MD;  Location: [facility] CATH LAB;  Service: Cardiovascular;  Laterality: N/A;  . OPEN REDUCTION INTERNAL FIXATION (ORIF) SCAPHOID WITH ILIAC CREST BONE GRAFT Left 08/09/2015   Procedure: OPEN REDUCTION INTERNAL FIXATION (ORIF) LEFT SCAPULA ACROMION ALLOGRAFT ;  Surgeon: Beverely Low, MD;  Location: [facility] OR;  Service: Orthopedics;  Laterality: Left;  . REVERSE SHOULDER ARTHROPLASTY Left 04/28/2014   Procedure: IRRIGATION AND DEBRIDEMENT SHOULDER WITH POLY EXCHANGE;  Surgeon: Verlee Rossetti, MD;  Location: [facility] OR;  Service: Orthopedics;  Laterality: Left;  . spurs removed from feet Bilateral 1994  . TOTAL SHOULDER ARTHROPLASTY Left 03/30/2014   Procedure: LEFT TOTAL REVERSE SHOULDER ARTHROPLASTY;  Surgeon: Verlee Rossetti, MD;  Location: [facility] OR;  Service: Orthopedics;  Laterality: Left;  . TUBAL LIGATION  1966   History reviewed. No pertinent family history. Social History  Substance Use Topics  . Smoking status: Never Smoker  . Smokeless tobacco: Never Used  . Alcohol use No   OB History    No data available     Review of Systems  Denies: HEADACHE, NAUSEA, ABDOMINAL PAIN, CHEST PAIN, CONGESTION, DYSURIA, SHORTNESS OF BREATH  Allergies  Ioxaglate; Ivp dye [iodinated diagnostic agents]; Nsaids; Aspirin; Imodium [loperamide]; and Other  Home Medications   Prior to Admission medications   Medication Sig Start Date End Date Taking? Authorizing Provider  acetaminophen (TYLENOL) 500 MG tablet Take 500 mg by mouth  at bedtime.    Historical Provider, MD  atorvastatin (LIPITOR) 20 MG tablet Take 20 mg by mouth daily.    Historical Provider, MD  clopidogrel (PLAVIX) 75 MG tablet Take 75 mg by mouth daily.    Historical Provider, MD  gabapentin (NEURONTIN) 300 MG capsule Take 300 mg by mouth 3 (three) times daily.  04/26/12   Historical Provider, MD  glimepiride (AMARYL) 2 MG tablet Take 2 mg by mouth 2 (two) times daily.    Historical Provider, MD  HYDROcodone-acetaminophen (NORCO/VICODIN) 5-325 MG tablet Take 1 tablet by mouth every 6 (six) hours as needed. Patient taking differently: Take 1 tablet by mouth every 6 (six) hours as needed for moderate pain.  03/25/15   Laurence Spates, MD  hydroxychloroquine (PLAQUENIL) 200 MG tablet Take 200 mg by mouth 2 (two) times daily.    Historical Provider, MD  inFLIXimab (REMICADE) 100 MG injection Inject 100 mg into the vein every 8 (eight) weeks.    Historical Provider, MD  lansoprazole (PREVACID) 15 MG capsule Take 15 mg by mouth 3 (three) times a week. Every Monday, Wednesday, friday    Historical Provider, MD  loratadine (CLARITIN) 10 MG tablet Take 10 mg by mouth daily.    Historical Provider, MD  losartan (COZAAR) 25 MG tablet Take 25 mg by mouth daily.    Historical Provider, MD  metFORMIN (GLUCOPHAGE) 1000 MG tablet Take 1,000 mg by mouth 2 (two) times daily with a meal.    Historical Provider, MD  methocarbamol (ROBAXIN) 500 MG tablet Take 500 mg by mouth 2 (two) times daily as needed for muscle spasms.     Historical Provider, MD  methocarbamol (ROBAXIN) 500 MG tablet Take 1 tablet (500 mg total) by mouth 3 (three) times daily as needed. 08/09/15   Beverely Low, MD  oxyCODONE-acetaminophen (ROXICET) 5-325 MG tablet Take 1-2 tablets by mouth every 4 (four) hours as needed for severe pain. 08/09/15   Beverely Low, MD  Probiotic Product (PROBIOTIC DAILY PO) Take 1 capsule by mouth daily. Equate Brand Probiotic    Historical Provider, MD  ranitidine (ZANTAC) 150  MG tablet Take 150 mg by mouth daily.    Historical Provider, MD  topiramate (TOPAMAX) 50 MG tablet Take 50 mg by mouth daily.    Historical Provider, MD  Wheat Dextrin (BENEFIBER DRINK MIX PO) Take 3 each by mouth daily. Mix 3 tablespoons in liquid and drink once daily.    Historical Provider, MD   Meds Ordered and Administered this Visit  Medications - No data to display  BP (!) 112/54   Pulse 77   Temp 97.7 F (36.5 C) (Oral)   Resp 12   SpO2 100%  No data found.   Physical Exam NURSES NOTES AND VITAL SIGNS REVIEWED. CONSTITUTIONAL: Well developed, well nourished, no acute distress HEENT: normocephalic, atraumatic EYES: Conjunctiva normal NECK:normal  ROM, supple, no adenopathy PULMONARY:No respiratory distress, normal effort ABDOMINAL: Soft, ND, NT BS+, No CVAT MUSCULOSKELETAL: Normal ROM of all extremities, both lower extremities are swollen without tenderness without redness tenderness negative Homans sign bilaterally. The edema on the right leg is to do 3+ edema on the left leg is 1-2+. Edema extends to the knee. SKIN: warm and dry without rash PSYCHIATRIC: Mood and affect, behavior are normal  Urgent Care Course   Clinical Course    Procedures (including critical care time)  Labs Review Labs Reviewed - No data to display  Imaging Review Dg Chest 2 View  Result Date: 01/17/2016 CLINICAL DATA:  Shortness of breath and leg swelling. EXAM: CHEST  2 VIEW COMPARISON:  04/28/2012 FINDINGS: The heart size and pulmonary vascularity are normal and the lungs are clear. No effusions. Accentuation of the thoracic kyphosis. Left total shoulder prosthesis in place. Plate and multiple screws is in place across the left acromioclavicular joint. No acute bone abnormality. IMPRESSION: No active cardiopulmonary disease. Electronically Signed   By: Francene Boyers M.D.   On: 01/17/2016 15:10    Chest x-ray is reviewed with patient prior to discharge. Visual Acuity Review  Right Eye  Distance:   Left Eye Distance:   Bilateral Distance:    Right Eye Near:   Left Eye Near:    Bilateral Near:        Patient is advised to increase her furosemide to 40 mg twice daily. She is also advised to keep her feet elevated chest x-ray is negative for CHF or pulmonary edema. Patient is chest pain free. MDM   1. Peripheral edema   2. Bilateral lower extremity edema     Patient is reassured that there are no issues that require transfer to higher level of care at this time or additional tests. Patient is advised to continue home symptomatic treatment. Patient is advised that if there are new or worsening symptoms to attend the emergency department, contact primary care provider, or return to UC. Instructions of care provided discharged home in stable condition.    THIS NOTE WAS GENERATED USING A VOICE RECOGNITION SOFTWARE PROGRAM. ALL REASONABLE EFFORTS  WERE MADE TO PROOFREAD THIS DOCUMENT FOR ACCURACY.  I have verbally reviewed the discharge instructions with the patient. A printed AVS was given to the patient.  All questions were answered prior to discharge.      Tharon Aquas, PA 01/17/16 Rickey Primus

## 2016-09-12 ENCOUNTER — Encounter (HOSPITAL_COMMUNITY): Payer: Self-pay | Admitting: Family Medicine

## 2016-09-12 ENCOUNTER — Ambulatory Visit (INDEPENDENT_AMBULATORY_CARE_PROVIDER_SITE_OTHER): Payer: Medicare Other

## 2016-09-12 ENCOUNTER — Ambulatory Visit (HOSPITAL_COMMUNITY)
Admission: EM | Admit: 2016-09-12 | Discharge: 2016-09-12 | Disposition: A | Discharge status: Home / Self Care | Payer: Medicare Other | Attending: Family Medicine | Admitting: Family Medicine

## 2016-09-12 DIAGNOSIS — R062 Wheezing: Secondary | ICD-10-CM | POA: Diagnosis not present

## 2016-09-12 DIAGNOSIS — R05 Cough: Secondary | ICD-10-CM | POA: Diagnosis not present

## 2016-09-12 DIAGNOSIS — I5020 Unspecified systolic (congestive) heart failure: Secondary | ICD-10-CM

## 2016-09-12 DIAGNOSIS — R0602 Shortness of breath: Secondary | ICD-10-CM

## 2016-09-12 DIAGNOSIS — J40 Bronchitis, not specified as acute or chronic: Secondary | ICD-10-CM

## 2016-09-12 MED ORDER — HYDROCODONE-HOMATROPINE 5-1.5 MG/5ML PO SYRP: 5.0000 mL | ORAL_SOLUTION | Freq: Four times a day (QID) | ORAL | 0 refills | Status: AC | PRN

## 2016-09-12 MED ORDER — ALBUTEROL SULFATE (2.5 MG/3ML) 0.083% IN NEBU
2.5000 mg | INHALATION_SOLUTION | Freq: Once | RESPIRATORY_TRACT | Status: AC
  Administered 2016-09-12: 2.5 mg via RESPIRATORY_TRACT

## 2016-09-12 MED ORDER — PREDNISONE 20 MG PO TABS: ORAL_TABLET | ORAL | 0 refills | Status: AC

## 2016-09-12 MED ORDER — ALBUTEROL SULFATE (2.5 MG/3ML) 0.083% IN NEBU
INHALATION_SOLUTION | RESPIRATORY_TRACT | Status: AC
  Filled 2016-09-12: qty 3

## 2016-09-12 NOTE — ED Triage Notes (Signed)
Pt here for SOB onset 5 days associated w/prod cough and wheezing, CP due to cough  Denies fevers, chills  Taking OTC Robitussin and Alka seltzer w/no relief.   A&O x4... Speaking in complete sentences... NAD

## 2016-09-12 NOTE — ED Provider Notes (Signed)
[facility]    CSN: [id number] Arrival date & time: 09/12/16  1201     History   Chief Complaint Chief Complaint  Patient presents with  . Shortness of Breath    HPI Shelia Barber is a 76 y.o. female.   20-year-old woman who comes in with 4 days of cough, shortness of breath, and wheezing. Her symptoms are worse at night. She says that the sputum has been yellowish today.  Patient needs to sleep in a recliner. She has chronic pedal edema. She has no history of asthma. There's been no fever recently.      Past Medical History:  Diagnosis Date  . Chronic back pain    DDD,scoliosis  . Cyst of kidney, acquired    right  . Diabetes mellitus without complication (HCC)    takes Amaryl daily  . Diverticulosis   . Gastritis   . GERD (gastroesophageal reflux disease)    takes Prevacid and Zantac daily  . H/O hiatal hernia   . History of colon polyps   . History of neck surgery 1993  . Hyperlipidemia    takes Atorvastatin daily  . Hypertension    takes Losartan daily  . Joint pain   . Joint swelling   . Migraines    frequent headaches recently but not migraines;last migraine a few months ago  . Neuropathy   . Osteoarthritis    takes Plaquenil daily  . Osteoporosis   . Peripheral neuropathy   . RA (rheumatoid arthritis) (HCC)    Remicade every 8wks   . Seasonal allergies    takes Claritin daily  . Sleep apnea    DOES NOT USE CPAP  . TIA (transient ischemic attack)    x 3 and takes Plavix daily  . Urinary frequency   . Urinary urgency     Patient Active Problem List   Diagnosis Date Noted  . Scapula fracture 08/09/2015  . Shoulder dislocation, recurrent 04/28/2014  . S/p reverse total shoulder arthroplasty 03/30/2014  . Atypical chest pain 04/29/2012  . Morbid obesity (HCC) 04/29/2012  . Essential hypertension 04/29/2012  . Sinus bradycardia 04/29/2012  . Cognitive decline 04/29/2012  . OSA (obstructive sleep apnea) 04/29/2012  .  Hyperlipidemia 04/29/2012  . Diabetes mellitus with neurological manifestation (HCC) 04/29/2012  . Rheumatoid arthritis(714.0) 04/29/2012    Past Surgical History:  Procedure Laterality Date  . ABDOMINAL HYSTERECTOMY  1994  . APPENDECTOMY  1963  . CARDIAC CATHETERIZATION  04/29/2012   30% proximal LAD lesion, otherwise normal coronaries; LVEF 55% and no WMAs   . CARPAL TUNNEL RELEASE Bilateral 1995  . cataract surgery Bilateral 2010  . CESAREAN SECTION  1963/1966  . CHOLECYSTECTOMY  2002  . COLONOSCOPY    . cyst removed from kidney Right 2002  . CYSTOSCOPY  2002  . FOOT SURGERY Right 1968  . JOINT REPLACEMENT  2010   left knee  . KNEE SURGERY Left 2010   d/t torn cartliage  . LEFT HEART CATHETERIZATION WITH CORONARY ANGIOGRAM N/A 04/29/2012   Procedure: LEFT HEART CATHETERIZATION WITH CORONARY ANGIOGRAM;  Surgeon: Wendall Stade, MD;  Location: [facility] CATH LAB;  Service: Cardiovascular;  Laterality: N/A;  . OPEN REDUCTION INTERNAL FIXATION (ORIF) SCAPHOID WITH ILIAC CREST BONE GRAFT Left 08/09/2015   Procedure: OPEN REDUCTION INTERNAL FIXATION (ORIF) LEFT SCAPULA ACROMION ALLOGRAFT ;  Surgeon: Beverely Low, MD;  Location: [facility] OR;  Service: Orthopedics;  Laterality: Left;  . REVERSE SHOULDER ARTHROPLASTY Left 04/28/2014   Procedure: IRRIGATION AND  DEBRIDEMENT SHOULDER WITH POLY EXCHANGE;  Surgeon: Verlee Rossetti, MD;  Location: [facility] OR;  Service: Orthopedics;  Laterality: Left;  . spurs removed from feet Bilateral 1994  . TOTAL SHOULDER ARTHROPLASTY Left 03/30/2014   Procedure: LEFT TOTAL REVERSE SHOULDER ARTHROPLASTY;  Surgeon: Verlee Rossetti, MD;  Location: [facility] OR;  Service: Orthopedics;  Laterality: Left;  . TUBAL LIGATION  1966    OB History    No data available       Home Medications    Prior to Admission medications   Medication Sig Start Date End Date Taking? Authorizing Provider  atorvastatin (LIPITOR) 20 MG tablet Take 20 mg by mouth daily.   Yes Historical Provider, MD    clopidogrel (PLAVIX) 75 MG tablet Take 75 mg by mouth daily.   Yes Historical Provider, MD  glimepiride (AMARYL) 2 MG tablet Take 2 mg by mouth 2 (two) times daily.   Yes Historical Provider, MD  hydroxychloroquine (PLAQUENIL) 200 MG tablet Take 200 mg by mouth 2 (two) times daily.   Yes Historical Provider, MD  inFLIXimab (REMICADE) 100 MG injection Inject 100 mg into the vein every 8 (eight) weeks.   Yes Historical Provider, MD  lansoprazole (PREVACID) 15 MG capsule Take 15 mg by mouth 3 (three) times a week. Every Monday, Wednesday, friday   Yes Historical Provider, MD  loratadine (CLARITIN) 10 MG tablet Take 10 mg by mouth daily.   Yes Historical Provider, MD  losartan (COZAAR) 25 MG tablet Take 25 mg by mouth daily.   Yes Historical Provider, MD  metFORMIN (GLUCOPHAGE) 1000 MG tablet Take 1,000 mg by mouth 2 (two) times daily with a meal.   Yes Historical Provider, MD  methocarbamol (ROBAXIN) 500 MG tablet Take 500 mg by mouth 2 (two) times daily as needed for muscle spasms.    Yes Historical Provider, MD  methocarbamol (ROBAXIN) 500 MG tablet Take 1 tablet (500 mg total) by mouth 3 (three) times daily as needed. 08/09/15  Yes Beverely Low, MD  pregabalin (LYRICA) 100 MG capsule Take 100 mg by mouth 2 (two) times daily.   Yes Historical Provider, MD  Probiotic Product (PROBIOTIC DAILY PO) Take 1 capsule by mouth daily. Equate Brand Probiotic   Yes Historical Provider, MD  Wheat Dextrin (BENEFIBER DRINK MIX PO) Take 3 each by mouth daily. Mix 3 tablespoons in liquid and drink once daily.   Yes Historical Provider, MD  acetaminophen (TYLENOL) 500 MG tablet Take 500 mg by mouth at bedtime.    Historical Provider, MD  HYDROcodone-homatropine (HYDROMET) 5-1.5 MG/5ML syrup Take 5 mLs by mouth every 6 (six) hours as needed for cough. 09/12/16   Elvina Sidle, MD  oxyCODONE-acetaminophen (ROXICET) 5-325 MG tablet Take 1-2 tablets by mouth every 4 (four) hours as needed for severe pain. 08/09/15   Beverely Low, MD  predniSONE (DELTASONE) 20 MG tablet Two daily with food 09/12/16   Elvina Sidle, MD    Family History History reviewed. No pertinent family history.  Social History Social History  Substance Use Topics  . Smoking status: Never Smoker  . Smokeless tobacco: Never Used  . Alcohol use No     Allergies   Ioxaglate; Ivp dye [iodinated diagnostic agents]; Nsaids; Aspirin; Imodium [loperamide]; and Other   Review of Systems Review of Systems  Constitutional: Positive for fatigue.  HENT: Positive for congestion.   Eyes: Negative for itching.  Respiratory: Positive for chest tightness and wheezing.   Cardiovascular: Positive for leg swelling.  Gastrointestinal: Negative.  Genitourinary: Negative.   Musculoskeletal: Negative.   Neurological: Negative.   Psychiatric/Behavioral: Negative.      Physical Exam Triage Vital Signs ED Triage Vitals [09/12/16 1211]  Enc Vitals Group     BP (!) 148/72     Pulse Rate 70     Resp (!) 24     Temp 98.3 F (36.8 C)     Temp Source Oral     SpO2 95 %     Weight      Height      Head Circumference      Peak Flow      Pain Score      Pain Loc      Pain Edu?      Excl. in GC?    No data found.   Updated Vital Signs BP (!) 148/72 (BP Location: Left Wrist)   Pulse 70   Temp 98.3 F (36.8 C) (Oral)   Resp (!) 24   SpO2 95%    Physical Exam  Constitutional: She is oriented to person, place, and time. She appears well-developed and well-nourished.  HENT:  Right Ear: External ear normal.  Left Ear: External ear normal.  Mouth/Throat: Oropharynx is clear and moist.  Eyes: Conjunctivae and EOM are normal. Pupils are equal, round, and reactive to light.  Neck: Normal range of motion. Neck supple.  Cardiovascular: Normal rate, regular rhythm and normal heart sounds.   Pulmonary/Chest: She has wheezes. She has rales.  Musculoskeletal: Normal range of motion. She exhibits edema.  Neurological: She is alert and  oriented to person, place, and time.  Skin: Skin is warm and dry.  Nursing note and vitals reviewed.    UC Treatments / Results  Labs (all labs ordered are listed, but only abnormal results are displayed) Labs Reviewed - No data to display  EKG  EKG Interpretation None        Radiology Dg Chest 2 View  Result Date: 09/12/2016 CLINICAL DATA:  76 year old female with history of cough for the past 3 days. Wheezing and shortness of breath. EXAM: CHEST  2 VIEW COMPARISON:  Chest x-ray 01/17/2016. FINDINGS: The mild diffuse peribronchial cuffing. Lung volumes are low. No consolidative airspace disease. No pleural effusions. No pneumothorax. No pulmonary nodule or mass noted. Pulmonary vasculature and the cardiomediastinal silhouette are within normal limits. Aortic atherosclerosis. Status post left shoulder arthroplasty. IMPRESSION: 1. Mild diffuse peribronchial cuffing, suggestive of an acute bronchitis. 2. Aortic atherosclerosis. Electronically Signed   By: Trudie Reed M.D.   On: 09/12/2016 12:48    Procedures Procedures (including critical care time)  Medications Ordered in UC Medications  albuterol (PROVENTIL) (2.5 MG/3ML) 0.083% nebulizer solution 2.5 mg (2.5 mg Nebulization Given 09/12/16 1237)     Initial Impression / Assessment and Plan / UC Course  I have reviewed the triage vital signs and the nursing notes.  Pertinent labs & imaging results that were available during my care of the patient were reviewed by me and considered in my medical decision making (see chart for details).     Final Clinical Impressions(s) / UC Diagnoses   Final diagnoses:  Bronchitis  Systolic congestive heart failure, unspecified HF chronicity (HCC)    New Prescriptions New Prescriptions   HYDROCODONE-HOMATROPINE (HYDROMET) 5-1.5 MG/5ML SYRUP    Take 5 mLs by mouth every 6 (six) hours as needed for cough.   PREDNISONE (DELTASONE) 20 MG TABLET    Two daily with food     Elvina Sidle, MD 09/12/16  1255  

## 2016-09-12 NOTE — Discharge Instructions (Signed)
Please increase your lasix dose to twice a day for 5 days to decrease the amount of extra fluid in your circulation and lungs.  Return tomorrow if not significantly improved.

## 2016-10-31 IMAGING — CR DG SHOULDER 2+V*L*
1 series · 1 of 1 positions shown · non-contrast
Comparison: Left shoulder radiographs performed earlier today at
[DATE] a.m.

CLINICAL DATA: Tightening of the left shoulder, with concern for
left shoulder dislocation. Subsequent encounter.

EXAM:
LEFT SHOULDER - 2+ VIEW

[AP]
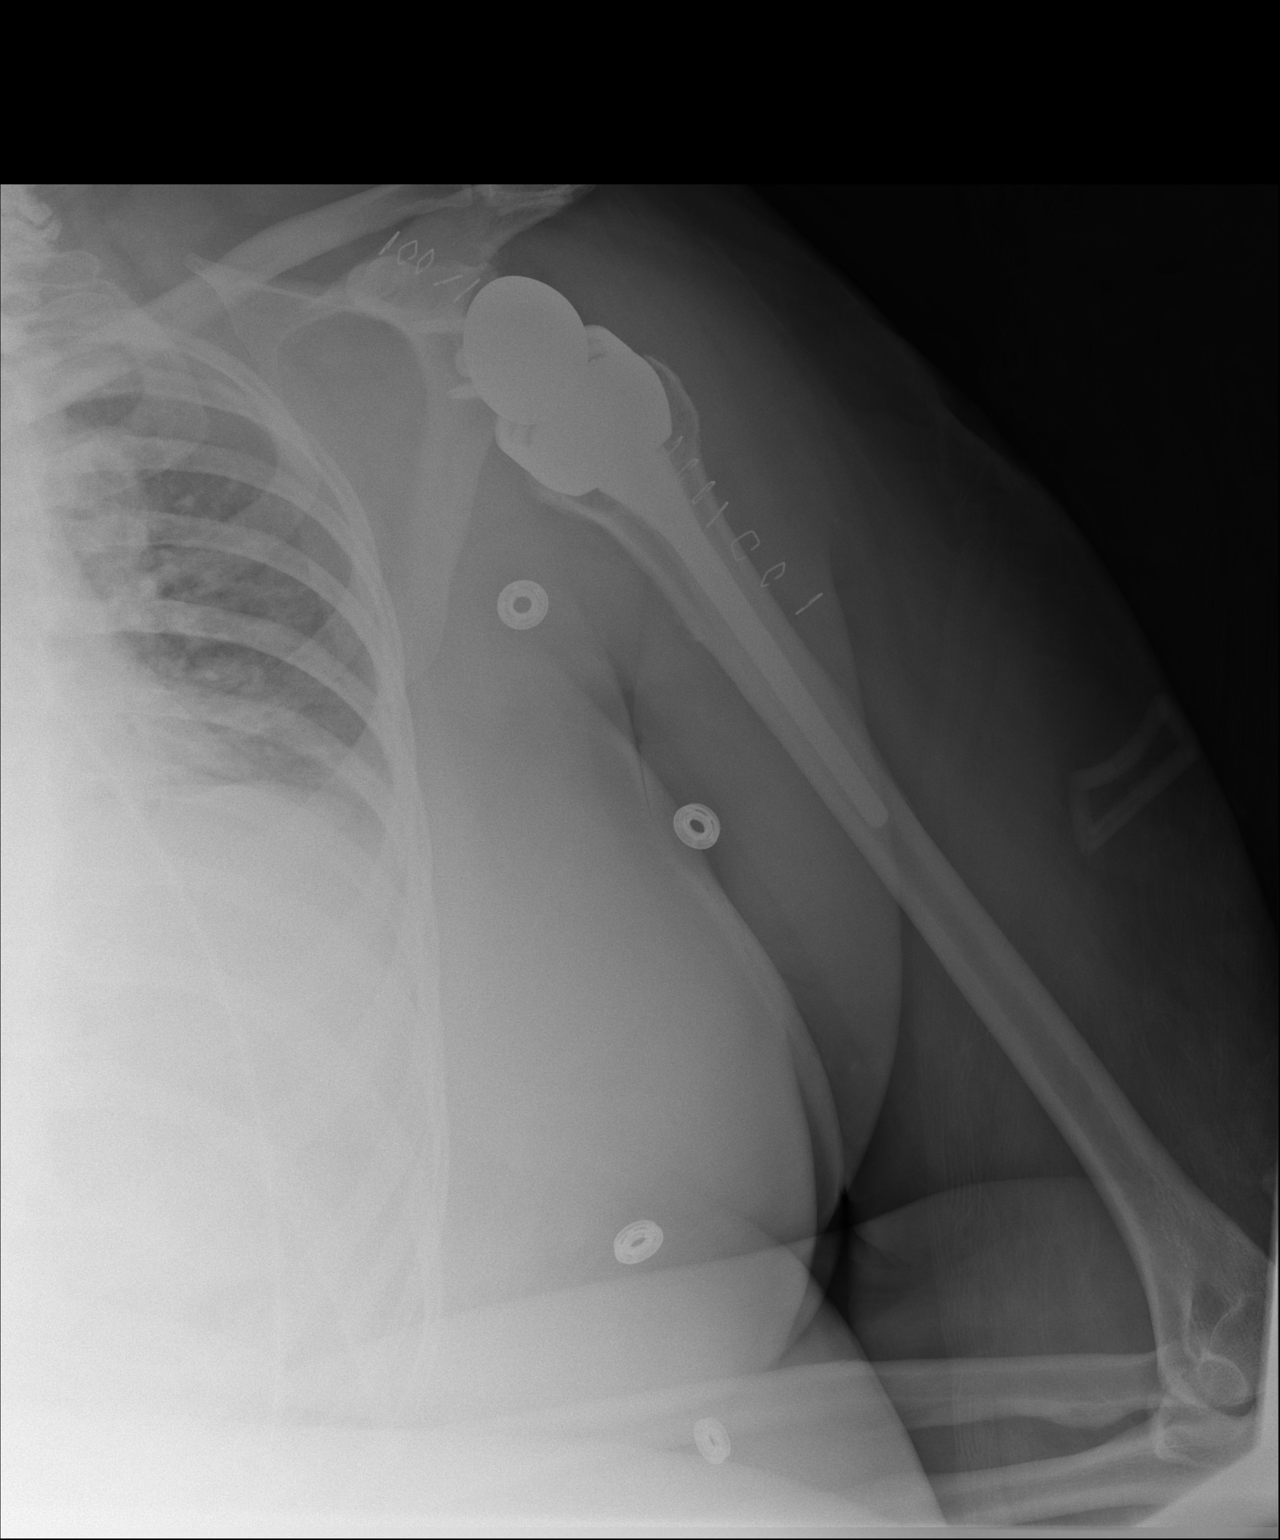

[1 of 1 positions shown; findings below may reference images not displayed]

FINDINGS: The location of the prosthesis is not well assessed without a
scapular Y-view. It is grossly unremarkable on the frontal view.
Overlying postoperative change is seen no fractures are identified.
The prosthesis appears grossly intact, without evidence of
loosening. Mild degenerative change is noted at the left
acromioclavicular joint.
IMPRESSION: Location of the prosthesis is not well assessed without a scapular
Y-view. It is grossly unremarkable in appearance on the frontal
view.

## 2017-09-27 IMAGING — CT CT CERVICAL SPINE W/O CM
4 of 6 series · 13 of 33 positions shown, 15 images · non-contrast
Comparison: 08/10/2013

CLINICAL DATA: Fall, hit left side head, left arm pain

EXAM:
CT HEAD WITHOUT CONTRAST
CT CERVICAL SPINE WITHOUT CONTRAST
TECHNIQUE: Multidetector CT imaging of the head and cervical spine was
performed following the standard protocol without intravenous
contrast. Multiplanar CT image reconstructions of the cervical spine
were also generated.

[Series 5: cervical st 2.0 b31s · axial · 0.35mm/px · z∈[+1240,+1300]mm · 2 of 92 slices shown]
[im 31/92  bone]
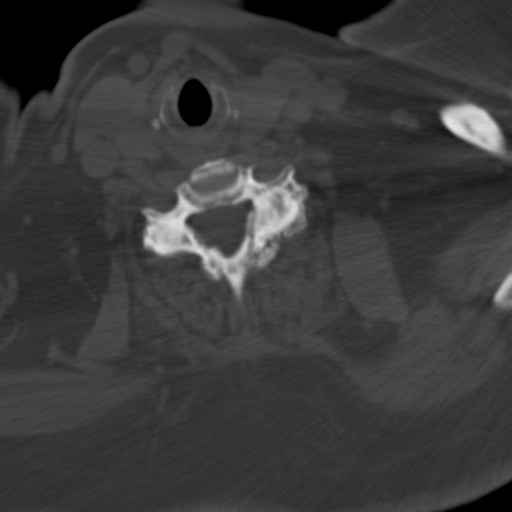
[im 61/92  bone]
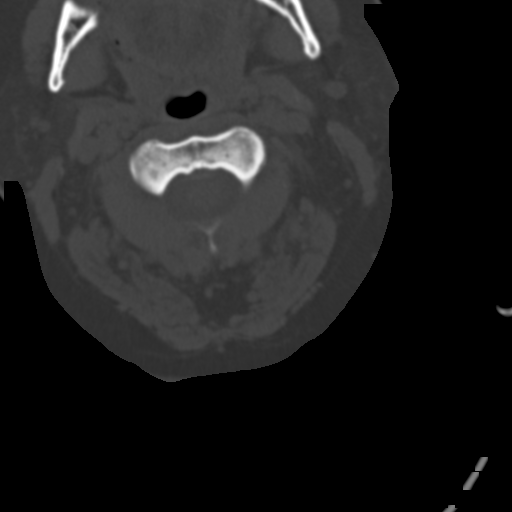

[Series 7: sagittal bone 2.0 · sagittal · 0.35mm/px · 5 of 60 slices shown, 6 images]
[im 20/60  bone]
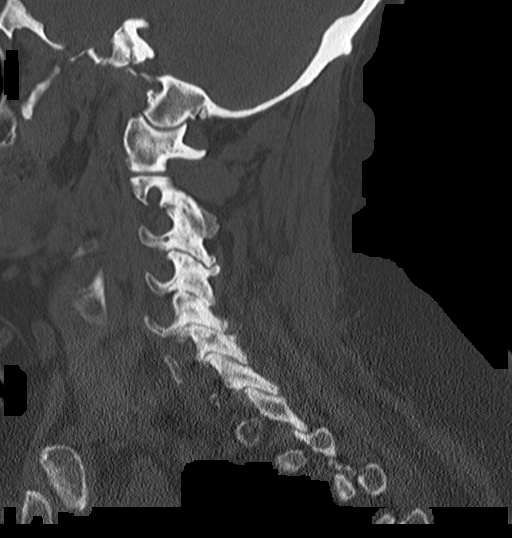
[im 25/60  bone]
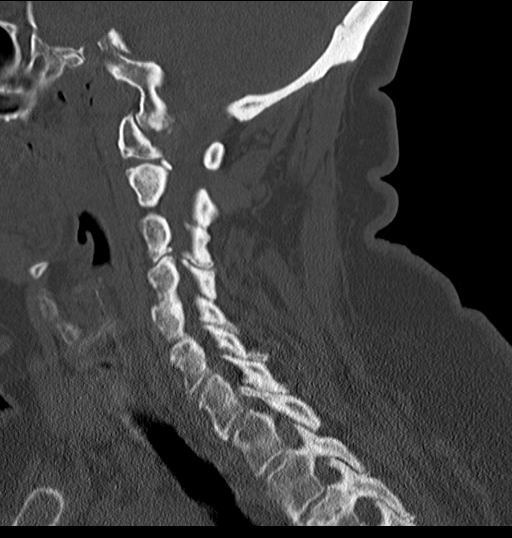
[im 30/60  soft-tissue]
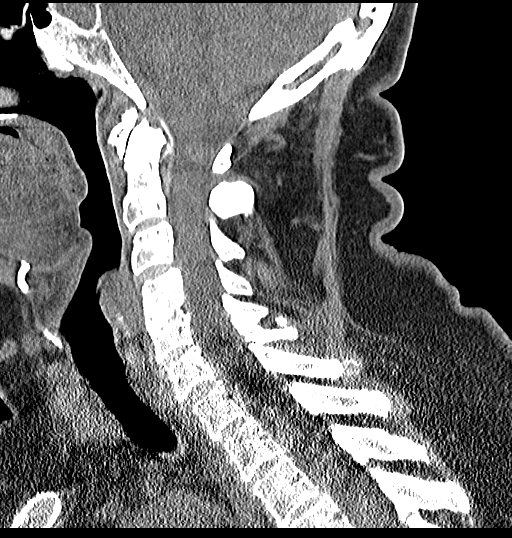
[im 30/60  bone]
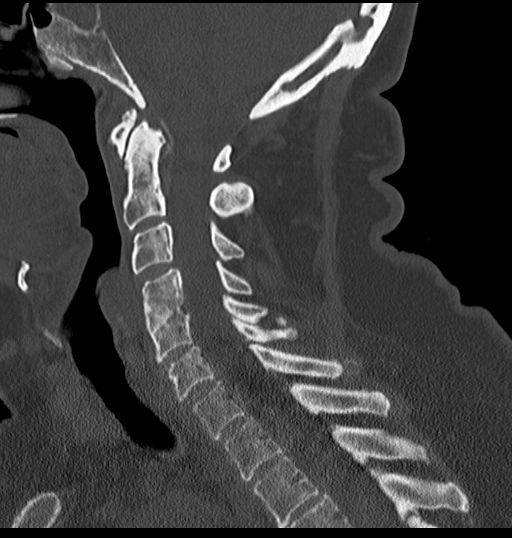
[im 35/60  bone]
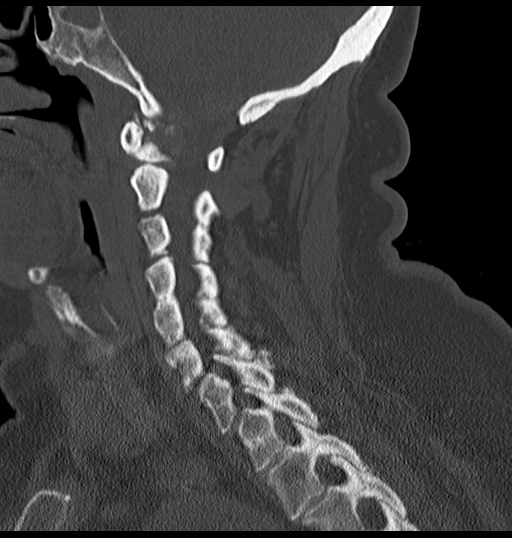
[im 40/60  bone]
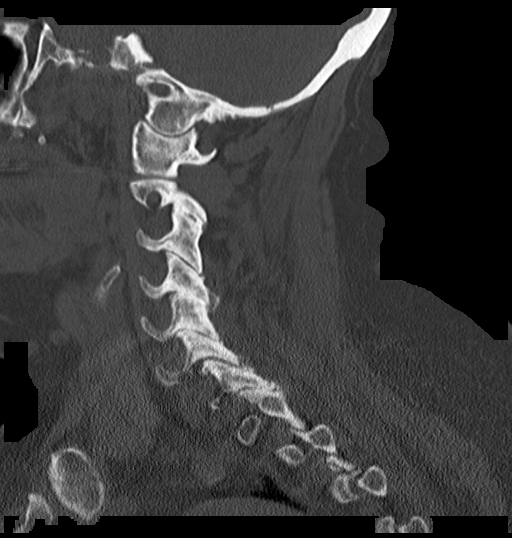

[Series 8: coronal bone 2.0 · coronal · 0.24mm/px · 3 of 69 slices shown]
[im 14/69  bone]
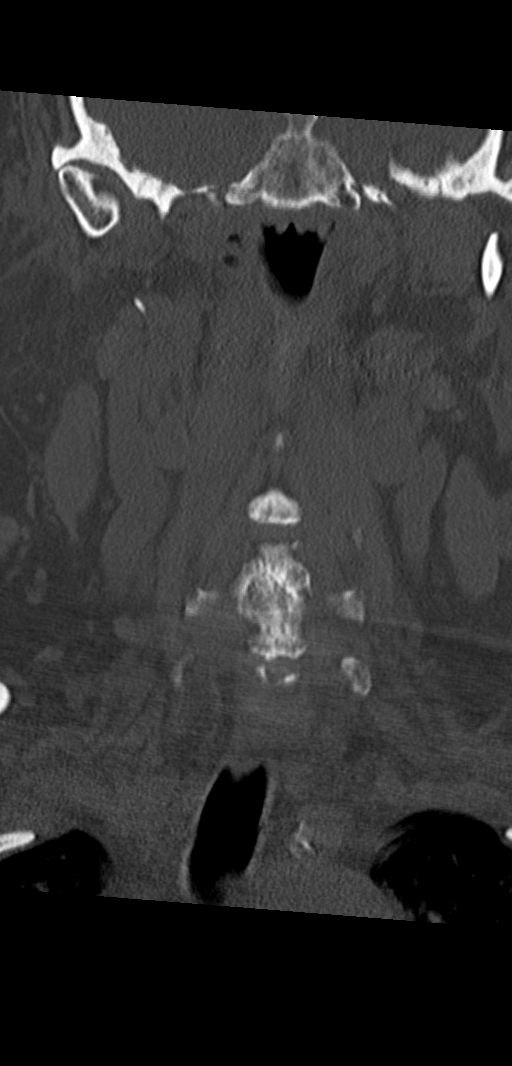
[im 28/69  bone]
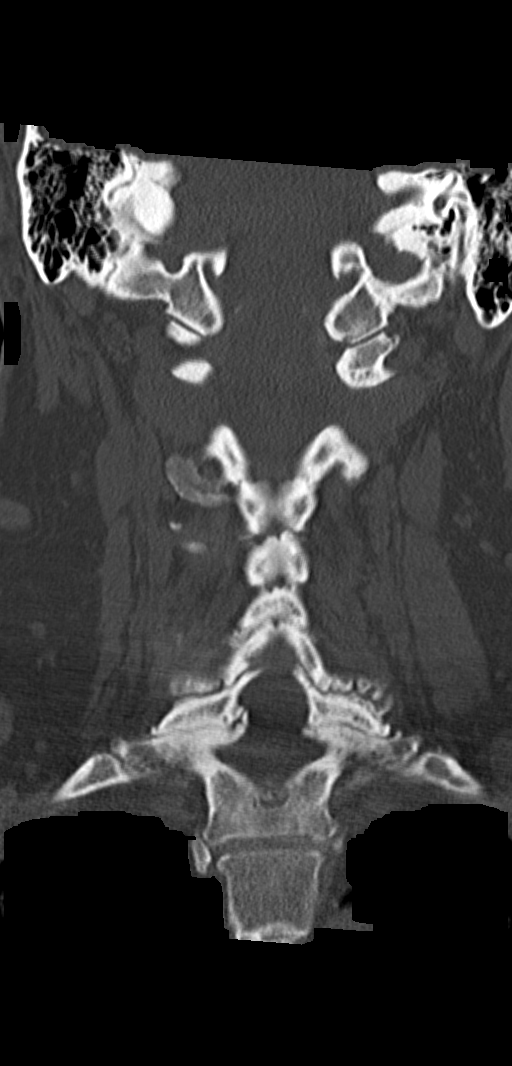
[im 41/69  bone]
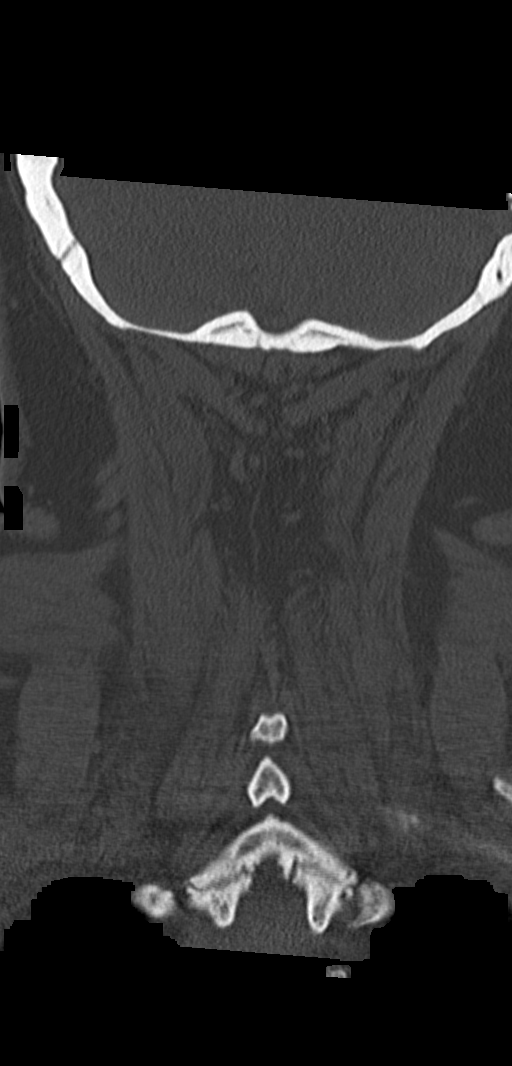

[Series 9: axial bone 2.0 · axial · 0.22mm/px · z∈[+1198,+1302]mm · 3 of 111 slices shown, 4 images]
[im 28/111  soft-tissue]
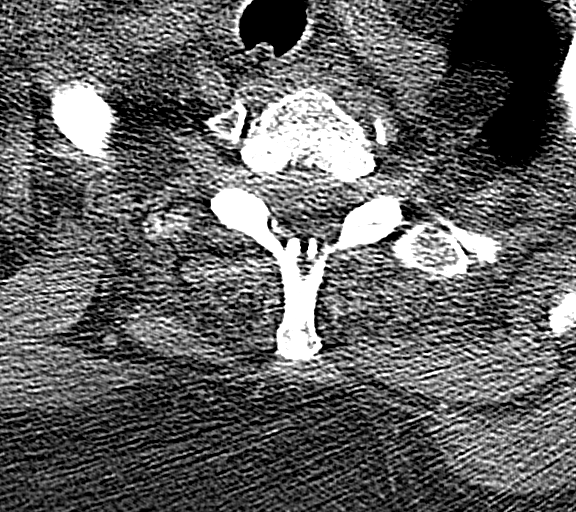
[im 28/111  bone]
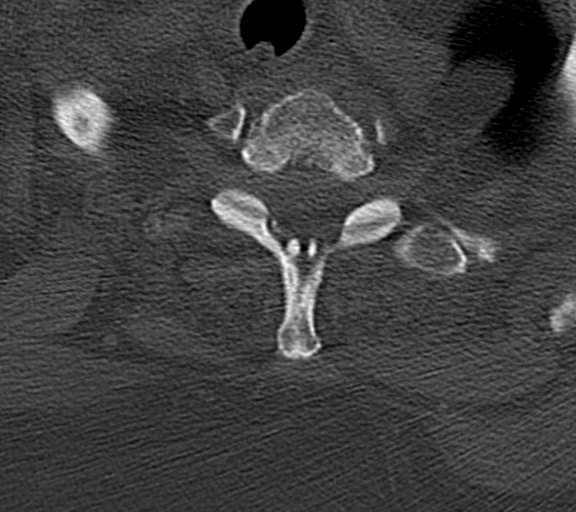
[im 56/111  bone]
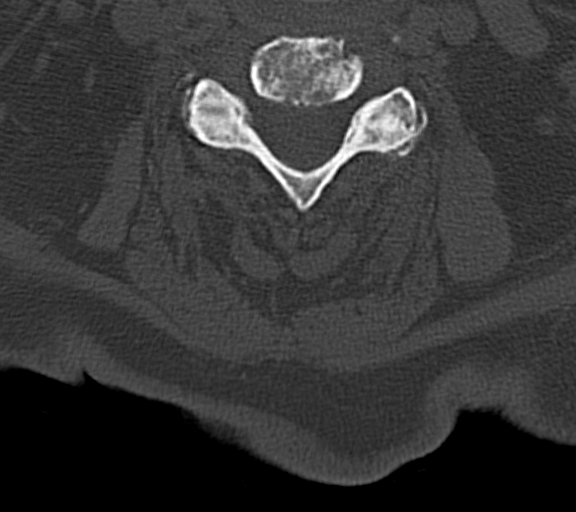
[im 83/111  bone]
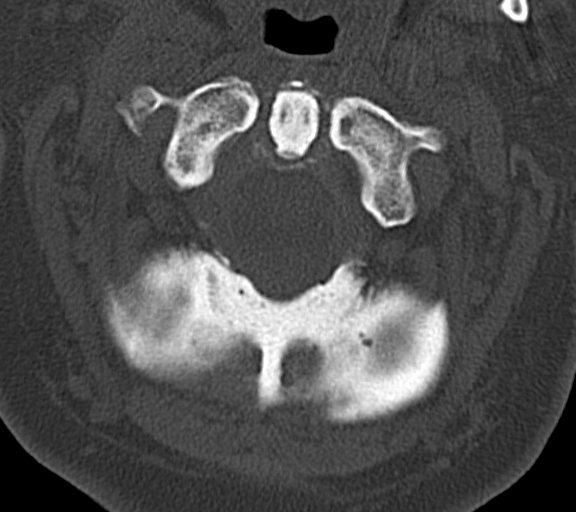

[13 of 33 positions shown; findings below may reference images not displayed]

FINDINGS: CT HEAD FINDINGS

No skull fracture is noted. Paranasal sinuses and mastoid air cells
are unremarkable. No intracranial hemorrhage, mass effect or midline
shift. Ventricular size is stable from prior exam. No acute cortical
infarction. No mass lesion is noted on this unenhanced scan.

CT CERVICAL SPINE FINDINGS

Axial images of the cervical spine shows no acute fracture or
subluxation. Computer processed images shows no acute fracture or
subluxation. Degenerative changes C1-C2 articulation. There is bony
fusion of C4-C5 vertebral bodies. Mild disc space flattening with
anterior spurring at C5-C6 level. No prevertebral soft tissue
swelling. Cervical airway is patent. Spinal canal is patent.

There is no pneumothorax in visualized lung apices. Facet
degenerative changes are noted at C5-C6 level.
IMPRESSION: 1. No acute intracranial abnormality.
2. No cervical spine acute fracture or subluxation. Degenerative
changes as described above.
